# Patient Record
Sex: Female | Born: 1960 | Race: White | Hispanic: No | Marital: Married | Smoking: Never smoker
Health system: Southern US, Community
[De-identification: ages and names within clinical notes are randomized; demographics above are authoritative.]

## PROBLEM LIST (undated history)

## (undated) DIAGNOSIS — R142 Eructation: Secondary | ICD-10-CM

## (undated) DIAGNOSIS — R51 Headache: Secondary | ICD-10-CM

## (undated) DIAGNOSIS — R21 Rash and other nonspecific skin eruption: Secondary | ICD-10-CM

## (undated) DIAGNOSIS — R143 Flatulence: Secondary | ICD-10-CM

## (undated) DIAGNOSIS — J45909 Unspecified asthma, uncomplicated: Secondary | ICD-10-CM

## (undated) DIAGNOSIS — I479 Paroxysmal tachycardia, unspecified: Secondary | ICD-10-CM

## (undated) DIAGNOSIS — K219 Gastro-esophageal reflux disease without esophagitis: Secondary | ICD-10-CM

## (undated) DIAGNOSIS — R61 Generalized hyperhidrosis: Secondary | ICD-10-CM

## (undated) DIAGNOSIS — M545 Low back pain, unspecified: Secondary | ICD-10-CM

## (undated) DIAGNOSIS — C50919 Malignant neoplasm of unspecified site of unspecified female breast: Secondary | ICD-10-CM

## (undated) DIAGNOSIS — M25519 Pain in unspecified shoulder: Secondary | ICD-10-CM

## (undated) DIAGNOSIS — R42 Dizziness and giddiness: Secondary | ICD-10-CM

## (undated) DIAGNOSIS — F329 Major depressive disorder, single episode, unspecified: Secondary | ICD-10-CM

## (undated) DIAGNOSIS — Z7721 Contact with and (suspected) exposure to potentially hazardous body fluids: Secondary | ICD-10-CM

## (undated) DIAGNOSIS — R9431 Abnormal electrocardiogram [ECG] [EKG]: Secondary | ICD-10-CM

## (undated) DIAGNOSIS — M542 Cervicalgia: Secondary | ICD-10-CM

## (undated) DIAGNOSIS — G510 Bell's palsy: Secondary | ICD-10-CM

## (undated) DIAGNOSIS — Z853 Personal history of malignant neoplasm of breast: Secondary | ICD-10-CM

## (undated) DIAGNOSIS — K6289 Other specified diseases of anus and rectum: Secondary | ICD-10-CM

## (undated) DIAGNOSIS — M79609 Pain in unspecified limb: Secondary | ICD-10-CM

## (undated) DIAGNOSIS — R141 Gas pain: Secondary | ICD-10-CM

## (undated) DIAGNOSIS — Z923 Personal history of irradiation: Secondary | ICD-10-CM

## (undated) DIAGNOSIS — R071 Chest pain on breathing: Secondary | ICD-10-CM

## (undated) DIAGNOSIS — G43019 Migraine without aura, intractable, without status migrainosus: Secondary | ICD-10-CM

## (undated) DIAGNOSIS — R635 Abnormal weight gain: Secondary | ICD-10-CM

## (undated) DIAGNOSIS — K589 Irritable bowel syndrome without diarrhea: Secondary | ICD-10-CM

## (undated) DIAGNOSIS — F32A Depression, unspecified: Secondary | ICD-10-CM

## (undated) DIAGNOSIS — N3090 Cystitis, unspecified without hematuria: Secondary | ICD-10-CM

## (undated) DIAGNOSIS — R1110 Vomiting, unspecified: Secondary | ICD-10-CM

## (undated) DIAGNOSIS — J309 Allergic rhinitis, unspecified: Secondary | ICD-10-CM

## (undated) DIAGNOSIS — K279 Peptic ulcer, site unspecified, unspecified as acute or chronic, without hemorrhage or perforation: Secondary | ICD-10-CM

## (undated) DIAGNOSIS — K449 Diaphragmatic hernia without obstruction or gangrene: Secondary | ICD-10-CM

## (undated) HISTORY — DX: Major depressive disorder, single episode, unspecified: F32.9

## (undated) HISTORY — DX: Paroxysmal tachycardia, unspecified: I47.9

## (undated) HISTORY — DX: Pain in unspecified limb: M79.609

## (undated) HISTORY — DX: Irritable bowel syndrome, unspecified: K58.9

## (undated) HISTORY — DX: Vomiting, unspecified: R11.10

## (undated) HISTORY — DX: Chest pain on breathing: R07.1

## (undated) HISTORY — DX: Rash and other nonspecific skin eruption: R21

## (undated) HISTORY — DX: Gastro-esophageal reflux disease without esophagitis: K21.9

## (undated) HISTORY — DX: Eructation: R14.2

## (undated) HISTORY — DX: Pain in unspecified shoulder: M25.519

## (undated) HISTORY — DX: Depression, unspecified: F32.A

## (undated) HISTORY — DX: Cervicalgia: M54.2

## (undated) HISTORY — DX: Allergic rhinitis, unspecified: J30.9

## (undated) HISTORY — DX: Dizziness and giddiness: R42

## (undated) HISTORY — DX: Malignant neoplasm of unspecified site of unspecified female breast: C50.919

## (undated) HISTORY — DX: Low back pain, unspecified: M54.50

## (undated) HISTORY — DX: Contact with and (suspected) exposure to potentially hazardous body fluids: Z77.21

## (undated) HISTORY — DX: Headache: R51

## (undated) HISTORY — DX: Flatulence: R14.3

## (undated) HISTORY — DX: Diaphragmatic hernia without obstruction or gangrene: K44.9

## (undated) HISTORY — DX: Other specified diseases of anus and rectum: K62.89

## (undated) HISTORY — PX: TUMOR REMOVAL: SHX12

## (undated) HISTORY — DX: Migraine without aura, intractable, without status migrainosus: G43.019

## (undated) HISTORY — DX: Gas pain: R14.1

## (undated) HISTORY — DX: Abnormal electrocardiogram (ECG) (EKG): R94.31

## (undated) HISTORY — DX: Generalized hyperhidrosis: R61

## (undated) HISTORY — DX: Personal history of malignant neoplasm of breast: Z85.3

## (undated) HISTORY — PX: BREAST LUMPECTOMY: SHX2

## (undated) HISTORY — DX: Low back pain: M54.5

## (undated) HISTORY — DX: Peptic ulcer, site unspecified, unspecified as acute or chronic, without hemorrhage or perforation: K27.9

## (undated) HISTORY — DX: Cystitis, unspecified without hematuria: N30.90

## (undated) HISTORY — PX: HYSTEROSCOPY WITH D & C: SHX1775

## (undated) HISTORY — DX: Unspecified asthma, uncomplicated: J45.909

## (undated) HISTORY — DX: Bell's palsy: G51.0

## (undated) HISTORY — DX: Abnormal weight gain: R63.5

---

## 1998-02-21 ENCOUNTER — Encounter: Payer: Self-pay | Admitting: Internal Medicine

## 1998-02-21 ENCOUNTER — Emergency Department (HOSPITAL_COMMUNITY): Admission: EM | Admit: 1998-02-21 | Discharge: 1998-02-21 | Payer: Self-pay | Admitting: Emergency Medicine

## 2000-01-10 ENCOUNTER — Other Ambulatory Visit: Admission: RE | Admit: 2000-01-10 | Discharge: 2000-01-10 | Payer: Self-pay | Admitting: Obstetrics & Gynecology

## 2000-05-12 HISTORY — PX: LIPOMA EXCISION: SHX5283

## 2000-05-12 HISTORY — PX: OTHER SURGICAL HISTORY: SHX169

## 2001-06-11 ENCOUNTER — Other Ambulatory Visit: Admission: RE | Admit: 2001-06-11 | Discharge: 2001-06-11 | Payer: Self-pay | Admitting: Obstetrics & Gynecology

## 2004-07-11 ENCOUNTER — Ambulatory Visit: Payer: Self-pay | Admitting: Internal Medicine

## 2004-09-26 ENCOUNTER — Ambulatory Visit: Payer: Self-pay | Admitting: Internal Medicine

## 2004-12-05 ENCOUNTER — Ambulatory Visit: Payer: Self-pay | Admitting: Internal Medicine

## 2005-04-21 ENCOUNTER — Ambulatory Visit: Payer: Self-pay | Admitting: Internal Medicine

## 2005-06-20 ENCOUNTER — Ambulatory Visit: Payer: Self-pay | Admitting: Internal Medicine

## 2005-10-23 ENCOUNTER — Ambulatory Visit: Payer: Self-pay | Admitting: Internal Medicine

## 2005-10-25 ENCOUNTER — Ambulatory Visit: Payer: Self-pay | Admitting: Family Medicine

## 2005-12-31 ENCOUNTER — Ambulatory Visit: Payer: Self-pay | Admitting: Internal Medicine

## 2006-01-28 ENCOUNTER — Ambulatory Visit: Payer: Self-pay | Admitting: Internal Medicine

## 2006-03-03 ENCOUNTER — Ambulatory Visit: Payer: Self-pay | Admitting: Internal Medicine

## 2006-03-20 ENCOUNTER — Ambulatory Visit: Payer: Self-pay | Admitting: Gastroenterology

## 2006-05-21 ENCOUNTER — Other Ambulatory Visit: Admission: RE | Admit: 2006-05-21 | Discharge: 2006-05-21 | Payer: Self-pay | Admitting: Gynecology

## 2006-06-15 ENCOUNTER — Encounter: Admission: RE | Admit: 2006-06-15 | Discharge: 2006-06-15 | Payer: Self-pay | Admitting: Gynecology

## 2006-07-01 ENCOUNTER — Encounter: Admission: RE | Admit: 2006-07-01 | Discharge: 2006-07-01 | Payer: Self-pay | Admitting: Gynecology

## 2006-09-01 ENCOUNTER — Emergency Department (HOSPITAL_COMMUNITY): Admission: EM | Admit: 2006-09-01 | Discharge: 2006-09-01 | Payer: Self-pay | Admitting: Family Medicine

## 2006-11-04 ENCOUNTER — Ambulatory Visit: Payer: Self-pay | Admitting: Internal Medicine

## 2006-11-19 ENCOUNTER — Ambulatory Visit: Payer: Self-pay | Admitting: Internal Medicine

## 2006-12-17 ENCOUNTER — Ambulatory Visit: Payer: Self-pay | Admitting: Internal Medicine

## 2006-12-18 ENCOUNTER — Encounter: Admission: RE | Admit: 2006-12-18 | Discharge: 2007-02-05 | Payer: Self-pay | Admitting: Internal Medicine

## 2007-01-30 ENCOUNTER — Encounter: Payer: Self-pay | Admitting: Internal Medicine

## 2007-01-30 DIAGNOSIS — K219 Gastro-esophageal reflux disease without esophagitis: Secondary | ICD-10-CM | POA: Insufficient documentation

## 2007-01-30 DIAGNOSIS — J45909 Unspecified asthma, uncomplicated: Secondary | ICD-10-CM | POA: Insufficient documentation

## 2007-02-09 ENCOUNTER — Encounter: Admission: RE | Admit: 2007-02-09 | Discharge: 2007-02-09 | Payer: Self-pay | Admitting: Gynecology

## 2007-02-09 ENCOUNTER — Encounter (INDEPENDENT_AMBULATORY_CARE_PROVIDER_SITE_OTHER): Payer: Self-pay | Admitting: Diagnostic Radiology

## 2007-02-14 ENCOUNTER — Encounter: Admission: RE | Admit: 2007-02-14 | Discharge: 2007-02-14 | Payer: Self-pay | Admitting: Gynecology

## 2007-02-15 ENCOUNTER — Encounter: Admission: RE | Admit: 2007-02-15 | Discharge: 2007-02-15 | Payer: Self-pay | Admitting: Gynecology

## 2007-02-15 ENCOUNTER — Encounter (INDEPENDENT_AMBULATORY_CARE_PROVIDER_SITE_OTHER): Payer: Self-pay | Admitting: Diagnostic Radiology

## 2007-03-01 ENCOUNTER — Encounter (INDEPENDENT_AMBULATORY_CARE_PROVIDER_SITE_OTHER): Payer: Self-pay | Admitting: Surgery

## 2007-03-01 ENCOUNTER — Ambulatory Visit (HOSPITAL_COMMUNITY): Admission: RE | Admit: 2007-03-01 | Discharge: 2007-03-01 | Payer: Self-pay | Admitting: Surgery

## 2007-03-03 ENCOUNTER — Ambulatory Visit: Payer: Self-pay | Admitting: Oncology

## 2007-03-08 ENCOUNTER — Ambulatory Visit: Admission: RE | Admit: 2007-03-08 | Discharge: 2007-05-12 | Payer: Self-pay | Admitting: Radiation Oncology

## 2007-03-09 ENCOUNTER — Encounter: Payer: Self-pay | Admitting: Internal Medicine

## 2007-03-09 LAB — COMPREHENSIVE METABOLIC PANEL
Albumin: 4.3 g/dL (ref 3.5–5.2)
Alkaline Phosphatase: 75 U/L (ref 39–117)
BUN: 13 mg/dL (ref 6–23)
Calcium: 9.3 mg/dL (ref 8.4–10.5)
Glucose, Bld: 98 mg/dL (ref 70–99)
Potassium: 4 mEq/L (ref 3.5–5.3)

## 2007-03-09 LAB — LACTATE DEHYDROGENASE: LDH: 146 U/L (ref 94–250)

## 2007-03-09 LAB — CBC WITH DIFFERENTIAL/PLATELET
Basophils Absolute: 0 10*3/uL (ref 0.0–0.1)
Eosinophils Absolute: 0 10*3/uL (ref 0.0–0.5)
HCT: 29.4 % — ABNORMAL LOW (ref 34.8–46.6)
HGB: 9.9 g/dL — ABNORMAL LOW (ref 11.6–15.9)
LYMPH%: 26.2 % (ref 14.0–48.0)
MCV: 77.4 fL — ABNORMAL LOW (ref 81.0–101.0)
MONO%: 9.2 % (ref 0.0–13.0)
NEUT#: 2.8 10*3/uL (ref 1.5–6.5)
NEUT%: 63.3 % (ref 39.6–76.8)
Platelets: 263 10*3/uL (ref 145–400)
RDW: 16.3 % — ABNORMAL HIGH (ref 11.3–14.5)

## 2007-03-10 ENCOUNTER — Encounter: Admission: RE | Admit: 2007-03-10 | Discharge: 2007-03-10 | Payer: Self-pay | Admitting: Oncology

## 2007-03-12 ENCOUNTER — Encounter (INDEPENDENT_AMBULATORY_CARE_PROVIDER_SITE_OTHER): Payer: Self-pay | Admitting: *Deleted

## 2007-03-12 ENCOUNTER — Ambulatory Visit (HOSPITAL_COMMUNITY): Admission: RE | Admit: 2007-03-12 | Discharge: 2007-03-12 | Payer: Self-pay | Admitting: Oncology

## 2007-03-14 LAB — VITAMIN D PNL(25-HYDRXY+1,25-DIHY)-BLD: Vit D, 25-Hydroxy: 15 ng/mL — ABNORMAL LOW (ref 30–89)

## 2007-03-15 ENCOUNTER — Other Ambulatory Visit: Admission: RE | Admit: 2007-03-15 | Discharge: 2007-03-15 | Payer: Self-pay | Admitting: Gynecology

## 2007-03-19 LAB — CBC & DIFF AND RETIC
BASO%: 0.5 % (ref 0.0–2.0)
Basophils Absolute: 0 10*3/uL (ref 0.0–0.1)
EOS%: 0.6 % (ref 0.0–7.0)
HCT: 26.8 % — ABNORMAL LOW (ref 34.8–46.6)
LYMPH%: 22.3 % (ref 14.0–48.0)
MCH: 26.7 pg (ref 26.0–34.0)
MCHC: 34.1 g/dL (ref 32.0–36.0)
MCV: 78.3 fL — ABNORMAL LOW (ref 81.0–101.0)
MONO%: 5.6 % (ref 0.0–13.0)
NEUT%: 71 % (ref 39.6–76.8)
lymph#: 1.1 10*3/uL (ref 0.9–3.3)

## 2007-03-19 LAB — IRON AND TIBC
%SAT: 4 % — ABNORMAL LOW (ref 20–55)
TIBC: 372 ug/dL (ref 250–470)

## 2007-03-19 LAB — FERRITIN: Ferritin: 22 ng/mL (ref 10–291)

## 2007-03-25 ENCOUNTER — Encounter: Payer: Self-pay | Admitting: Internal Medicine

## 2007-03-29 LAB — ESTRADIOL, ULTRA SENS: Estradiol, Ultra Sensitive: 181 pg/mL

## 2007-04-14 ENCOUNTER — Ambulatory Visit: Payer: Self-pay | Admitting: Oncology

## 2007-04-29 LAB — CHCC SMEAR

## 2007-04-29 LAB — CBC & DIFF AND RETIC
Basophils Absolute: 0 10*3/uL (ref 0.0–0.1)
Eosinophils Absolute: 0 10*3/uL (ref 0.0–0.5)
HCT: 34.2 % — ABNORMAL LOW (ref 34.8–46.6)
HGB: 11.8 g/dL (ref 11.6–15.9)
IRF: 0.27 (ref 0.130–0.330)
LYMPH%: 16.6 % (ref 14.0–48.0)
MONO#: 0.4 10*3/uL (ref 0.1–0.9)
NEUT#: 3.6 10*3/uL (ref 1.5–6.5)
NEUT%: 75.2 % (ref 39.6–76.8)
Platelets: 210 10*3/uL (ref 145–400)
WBC: 4.8 10*3/uL (ref 3.9–10.0)
lymph#: 0.8 10*3/uL — ABNORMAL LOW (ref 0.9–3.3)

## 2007-04-29 LAB — FERRITIN: Ferritin: 11 ng/mL (ref 10–291)

## 2007-04-29 LAB — COMPREHENSIVE METABOLIC PANEL
ALT: 16 U/L (ref 0–35)
AST: 16 U/L (ref 0–37)
Albumin: 4.3 g/dL (ref 3.5–5.2)
Alkaline Phosphatase: 56 U/L (ref 39–117)
Potassium: 3.8 mEq/L (ref 3.5–5.3)
Sodium: 140 mEq/L (ref 135–145)
Total Protein: 6.7 g/dL (ref 6.0–8.3)

## 2007-04-29 LAB — IRON AND TIBC: %SAT: 26 % (ref 20–55)

## 2007-04-29 LAB — CANCER ANTIGEN 27.29: CA 27.29: 18 U/mL (ref 0–39)

## 2007-05-13 ENCOUNTER — Ambulatory Visit: Admission: RE | Admit: 2007-05-13 | Discharge: 2007-06-06 | Payer: Self-pay | Admitting: Radiation Oncology

## 2007-06-03 ENCOUNTER — Telehealth: Payer: Self-pay | Admitting: Internal Medicine

## 2007-06-03 ENCOUNTER — Encounter: Payer: Self-pay | Admitting: Internal Medicine

## 2007-06-16 ENCOUNTER — Ambulatory Visit: Payer: Self-pay | Admitting: Oncology

## 2007-07-02 LAB — COMPREHENSIVE METABOLIC PANEL
ALT: 16 U/L (ref 0–35)
BUN: 10 mg/dL (ref 6–23)
CO2: 25 mEq/L (ref 19–32)
Calcium: 9.1 mg/dL (ref 8.4–10.5)
Chloride: 102 mEq/L (ref 96–112)
Creatinine, Ser: 0.69 mg/dL (ref 0.40–1.20)
Total Bilirubin: 0.6 mg/dL (ref 0.3–1.2)

## 2007-07-02 LAB — CBC & DIFF AND RETIC
BASO%: 0.4 % (ref 0.0–2.0)
EOS%: 0.9 % (ref 0.0–7.0)
HCT: 32.3 % — ABNORMAL LOW (ref 34.8–46.6)
LYMPH%: 18.5 % (ref 14.0–48.0)
MCH: 30.5 pg (ref 26.0–34.0)
MCHC: 35.5 g/dL (ref 32.0–36.0)
MONO#: 0.3 10*3/uL (ref 0.1–0.9)
NEUT%: 73.3 % (ref 39.6–76.8)
Platelets: 181 10*3/uL (ref 145–400)
RBC: 3.76 10*6/uL (ref 3.70–5.32)
Retic %: 0.6 % (ref 0.4–2.3)
WBC: 3.8 10*3/uL — ABNORMAL LOW (ref 3.9–10.0)
lymph#: 0.7 10*3/uL — ABNORMAL LOW (ref 0.9–3.3)

## 2007-07-02 LAB — IRON AND TIBC: Iron: 75 ug/dL (ref 42–145)

## 2007-07-02 LAB — CHCC SMEAR

## 2007-07-02 LAB — LACTATE DEHYDROGENASE: LDH: 148 U/L (ref 94–250)

## 2007-07-16 ENCOUNTER — Ambulatory Visit: Payer: Self-pay | Admitting: Internal Medicine

## 2007-07-22 ENCOUNTER — Encounter: Payer: Self-pay | Admitting: Internal Medicine

## 2007-07-22 ENCOUNTER — Ambulatory Visit: Payer: Self-pay | Admitting: Internal Medicine

## 2007-07-23 ENCOUNTER — Ambulatory Visit: Payer: Self-pay | Admitting: Internal Medicine

## 2007-07-23 DIAGNOSIS — R42 Dizziness and giddiness: Secondary | ICD-10-CM | POA: Insufficient documentation

## 2007-07-23 DIAGNOSIS — M542 Cervicalgia: Secondary | ICD-10-CM | POA: Insufficient documentation

## 2007-07-23 DIAGNOSIS — R635 Abnormal weight gain: Secondary | ICD-10-CM | POA: Insufficient documentation

## 2007-07-23 DIAGNOSIS — C50919 Malignant neoplasm of unspecified site of unspecified female breast: Secondary | ICD-10-CM | POA: Insufficient documentation

## 2007-07-27 ENCOUNTER — Encounter: Payer: Self-pay | Admitting: Internal Medicine

## 2007-08-03 ENCOUNTER — Encounter: Payer: Self-pay | Admitting: Internal Medicine

## 2007-08-03 ENCOUNTER — Telehealth: Payer: Self-pay | Admitting: Internal Medicine

## 2007-08-11 ENCOUNTER — Encounter: Admission: RE | Admit: 2007-08-11 | Discharge: 2007-11-09 | Payer: Self-pay | Admitting: Internal Medicine

## 2007-08-11 ENCOUNTER — Encounter: Payer: Self-pay | Admitting: Internal Medicine

## 2007-08-27 ENCOUNTER — Ambulatory Visit: Payer: Self-pay | Admitting: Internal Medicine

## 2007-08-27 ENCOUNTER — Encounter: Payer: Self-pay | Admitting: Internal Medicine

## 2007-08-27 DIAGNOSIS — R071 Chest pain on breathing: Secondary | ICD-10-CM | POA: Insufficient documentation

## 2007-08-27 DIAGNOSIS — R61 Generalized hyperhidrosis: Secondary | ICD-10-CM | POA: Insufficient documentation

## 2007-08-27 DIAGNOSIS — J309 Allergic rhinitis, unspecified: Secondary | ICD-10-CM | POA: Insufficient documentation

## 2007-09-03 DIAGNOSIS — K6289 Other specified diseases of anus and rectum: Secondary | ICD-10-CM | POA: Insufficient documentation

## 2007-09-03 DIAGNOSIS — K279 Peptic ulcer, site unspecified, unspecified as acute or chronic, without hemorrhage or perforation: Secondary | ICD-10-CM | POA: Insufficient documentation

## 2007-09-03 DIAGNOSIS — K449 Diaphragmatic hernia without obstruction or gangrene: Secondary | ICD-10-CM | POA: Insufficient documentation

## 2007-09-07 ENCOUNTER — Ambulatory Visit: Payer: Self-pay | Admitting: Oncology

## 2007-09-08 ENCOUNTER — Encounter: Payer: Self-pay | Admitting: Internal Medicine

## 2007-09-09 ENCOUNTER — Encounter: Payer: Self-pay | Admitting: Internal Medicine

## 2007-09-24 LAB — CBC WITH DIFFERENTIAL/PLATELET
BASO%: 0.4 % (ref 0.0–2.0)
EOS%: 0.8 % (ref 0.0–7.0)
HCT: 33.8 % — ABNORMAL LOW (ref 34.8–46.6)
MCH: 30.8 pg (ref 26.0–34.0)
MCHC: 35.1 g/dL (ref 32.0–36.0)
MONO#: 0.3 10*3/uL (ref 0.1–0.9)
RBC: 3.85 10*6/uL (ref 3.70–5.32)
RDW: 12.7 % (ref 11.3–14.5)
WBC: 4 10*3/uL (ref 3.9–10.0)
lymph#: 0.8 10*3/uL — ABNORMAL LOW (ref 0.9–3.3)

## 2007-09-24 LAB — COMPREHENSIVE METABOLIC PANEL
ALT: 16 U/L (ref 0–35)
AST: 20 U/L (ref 0–37)
Albumin: 4.3 g/dL (ref 3.5–5.2)
CO2: 27 mEq/L (ref 19–32)
Calcium: 9.1 mg/dL (ref 8.4–10.5)
Chloride: 108 mEq/L (ref 96–112)
Creatinine, Ser: 0.74 mg/dL (ref 0.40–1.20)
Potassium: 3.6 mEq/L (ref 3.5–5.3)
Sodium: 143 mEq/L (ref 135–145)
Total Protein: 6.7 g/dL (ref 6.0–8.3)

## 2007-09-24 LAB — CANCER ANTIGEN 27.29: CA 27.29: 18 U/mL (ref 0–39)

## 2007-09-30 ENCOUNTER — Emergency Department (HOSPITAL_COMMUNITY): Admission: EM | Admit: 2007-09-30 | Discharge: 2007-09-30 | Payer: Self-pay | Admitting: Family Medicine

## 2007-09-30 LAB — ESTRADIOL, ULTRA SENS: Estradiol, Ultra Sensitive: 45 pg/mL

## 2007-10-01 ENCOUNTER — Ambulatory Visit: Payer: Self-pay | Admitting: Internal Medicine

## 2007-10-01 DIAGNOSIS — Z7721 Contact with and (suspected) exposure to potentially hazardous body fluids: Secondary | ICD-10-CM | POA: Insufficient documentation

## 2007-10-01 DIAGNOSIS — R21 Rash and other nonspecific skin eruption: Secondary | ICD-10-CM | POA: Insufficient documentation

## 2007-11-10 HISTORY — PX: OTHER SURGICAL HISTORY: SHX169

## 2007-11-26 ENCOUNTER — Ambulatory Visit: Payer: Self-pay | Admitting: Internal Medicine

## 2008-01-13 ENCOUNTER — Telehealth: Payer: Self-pay | Admitting: Internal Medicine

## 2008-01-14 ENCOUNTER — Ambulatory Visit: Payer: Self-pay | Admitting: Oncology

## 2008-01-14 ENCOUNTER — Ambulatory Visit: Payer: Self-pay | Admitting: Internal Medicine

## 2008-01-14 LAB — CBC WITH DIFFERENTIAL/PLATELET
Basophils Absolute: 0 10*3/uL (ref 0.0–0.1)
HCT: 34.3 % — ABNORMAL LOW (ref 34.8–46.6)
HGB: 11.9 g/dL (ref 11.6–15.9)
LYMPH%: 18 % (ref 14.0–48.0)
MONO#: 0.4 10*3/uL (ref 0.1–0.9)
NEUT%: 74.5 % (ref 39.6–76.8)
Platelets: 174 10*3/uL (ref 145–400)
WBC: 5.4 10*3/uL (ref 3.9–10.0)
lymph#: 1 10*3/uL (ref 0.9–3.3)

## 2008-01-14 LAB — LACTATE DEHYDROGENASE: LDH: 158 U/L (ref 94–250)

## 2008-01-14 LAB — COMPREHENSIVE METABOLIC PANEL
BUN: 13 mg/dL (ref 6–23)
CO2: 25 mEq/L (ref 19–32)
Calcium: 9.4 mg/dL (ref 8.4–10.5)
Chloride: 109 mEq/L (ref 96–112)
Creatinine, Ser: 0.62 mg/dL (ref 0.40–1.20)
Glucose, Bld: 89 mg/dL (ref 70–99)

## 2008-01-14 LAB — CANCER ANTIGEN 27.29: CA 27.29: 19 U/mL (ref 0–39)

## 2008-01-27 ENCOUNTER — Encounter: Payer: Self-pay | Admitting: Internal Medicine

## 2008-02-11 ENCOUNTER — Ambulatory Visit (HOSPITAL_COMMUNITY): Admission: RE | Admit: 2008-02-11 | Discharge: 2008-02-11 | Payer: Self-pay | Admitting: Oncology

## 2008-02-18 ENCOUNTER — Ambulatory Visit: Payer: Self-pay | Admitting: Internal Medicine

## 2008-02-18 DIAGNOSIS — R141 Gas pain: Secondary | ICD-10-CM | POA: Insufficient documentation

## 2008-02-18 DIAGNOSIS — R143 Flatulence: Secondary | ICD-10-CM

## 2008-02-18 DIAGNOSIS — R142 Eructation: Secondary | ICD-10-CM

## 2008-03-06 ENCOUNTER — Other Ambulatory Visit: Admission: RE | Admit: 2008-03-06 | Discharge: 2008-03-06 | Payer: Self-pay | Admitting: Obstetrics & Gynecology

## 2008-03-10 ENCOUNTER — Ambulatory Visit (HOSPITAL_COMMUNITY): Admission: RE | Admit: 2008-03-10 | Discharge: 2008-03-10 | Payer: Self-pay | Admitting: Obstetrics & Gynecology

## 2008-03-22 ENCOUNTER — Ambulatory Visit: Payer: Self-pay | Admitting: Internal Medicine

## 2008-03-22 DIAGNOSIS — R1110 Vomiting, unspecified: Secondary | ICD-10-CM | POA: Insufficient documentation

## 2008-03-22 LAB — CONVERTED CEMR LAB: Estradiol: 60.7 pg/mL

## 2008-03-24 ENCOUNTER — Ambulatory Visit: Payer: Self-pay | Admitting: Oncology

## 2008-03-24 ENCOUNTER — Telehealth: Payer: Self-pay | Admitting: Internal Medicine

## 2008-03-24 LAB — CONVERTED CEMR LAB
ALT: 22 units/L (ref 0–35)
AST: 21 units/L (ref 0–37)
Albumin: 3.5 g/dL (ref 3.5–5.2)
Alkaline Phosphatase: 50 units/L (ref 39–117)
Basophils Absolute: 0.1 10*3/uL (ref 0.0–0.1)
Basophils Relative: 1.2 % (ref 0.0–3.0)
Bilirubin, Direct: 0.1 mg/dL (ref 0.0–0.3)
CO2: 28 meq/L (ref 19–32)
Crystals: NEGATIVE
Eosinophils Relative: 1.2 % (ref 0.0–5.0)
HCT: 33.7 % — ABNORMAL LOW (ref 36.0–46.0)
Hemoglobin: 11.5 g/dL — ABNORMAL LOW (ref 12.0–15.0)
Lymphocytes Relative: 24.7 % (ref 12.0–46.0)
MCV: 90 fL (ref 78.0–100.0)
Monocytes Relative: 10.6 % (ref 3.0–12.0)
Mucus, UA: NEGATIVE
Neutrophils Relative %: 62.3 % (ref 43.0–77.0)
Platelets: 171 10*3/uL (ref 150–400)
Sodium: 139 meq/L (ref 135–145)
Total Protein: 6.1 g/dL (ref 6.0–8.3)
WBC: 4.9 10*3/uL (ref 4.5–10.5)

## 2008-03-24 LAB — CBC WITH DIFFERENTIAL/PLATELET
BASO%: 0.5 % (ref 0.0–2.0)
HCT: 32.8 % — ABNORMAL LOW (ref 34.8–46.6)
LYMPH%: 25 % (ref 14.0–48.0)
MCHC: 34.4 g/dL (ref 32.0–36.0)
MCV: 90.1 fL (ref 81.0–101.0)
MONO#: 0.3 10*3/uL (ref 0.1–0.9)
MONO%: 8 % (ref 0.0–13.0)
NEUT%: 65.3 % (ref 39.6–76.8)
Platelets: 185 10*3/uL (ref 145–400)
RBC: 3.64 10*6/uL — ABNORMAL LOW (ref 3.70–5.32)
WBC: 4.3 10*3/uL (ref 3.9–10.0)

## 2008-03-30 ENCOUNTER — Encounter: Payer: Self-pay | Admitting: Internal Medicine

## 2008-03-31 ENCOUNTER — Ambulatory Visit: Payer: Self-pay | Admitting: Cardiovascular Disease

## 2008-04-01 LAB — ESTRADIOL, ULTRA SENS

## 2008-04-21 DIAGNOSIS — K589 Irritable bowel syndrome without diarrhea: Secondary | ICD-10-CM | POA: Insufficient documentation

## 2008-04-21 LAB — COMPREHENSIVE METABOLIC PANEL
ALT: 24 U/L (ref 0–35)
Albumin: 3.9 g/dL (ref 3.5–5.2)
Alkaline Phosphatase: 48 U/L (ref 39–117)
CO2: 26 mEq/L (ref 19–32)
Glucose, Bld: 109 mg/dL — ABNORMAL HIGH (ref 70–99)
Potassium: 3.6 mEq/L (ref 3.5–5.3)
Sodium: 140 mEq/L (ref 135–145)
Total Protein: 6.7 g/dL (ref 6.0–8.3)

## 2008-04-21 LAB — HCG, SERUM, QUALITATIVE: Preg, Serum: NEGATIVE

## 2008-04-26 ENCOUNTER — Encounter: Payer: Self-pay | Admitting: Internal Medicine

## 2008-05-13 IMAGING — CR DG CHEST 2V
2 series · 2 of 2 positions shown · non-contrast
Comparison: none

CLINICAL DATA: Breast carcinoma, preop

Chest 2 view:
No previous available for comparison. The heart size and mediastinal contours
are within normal limits.  Both lungs are clear.  The visualized skeletal
structures are unremarkable.

[view not recorded (1 of 2)]
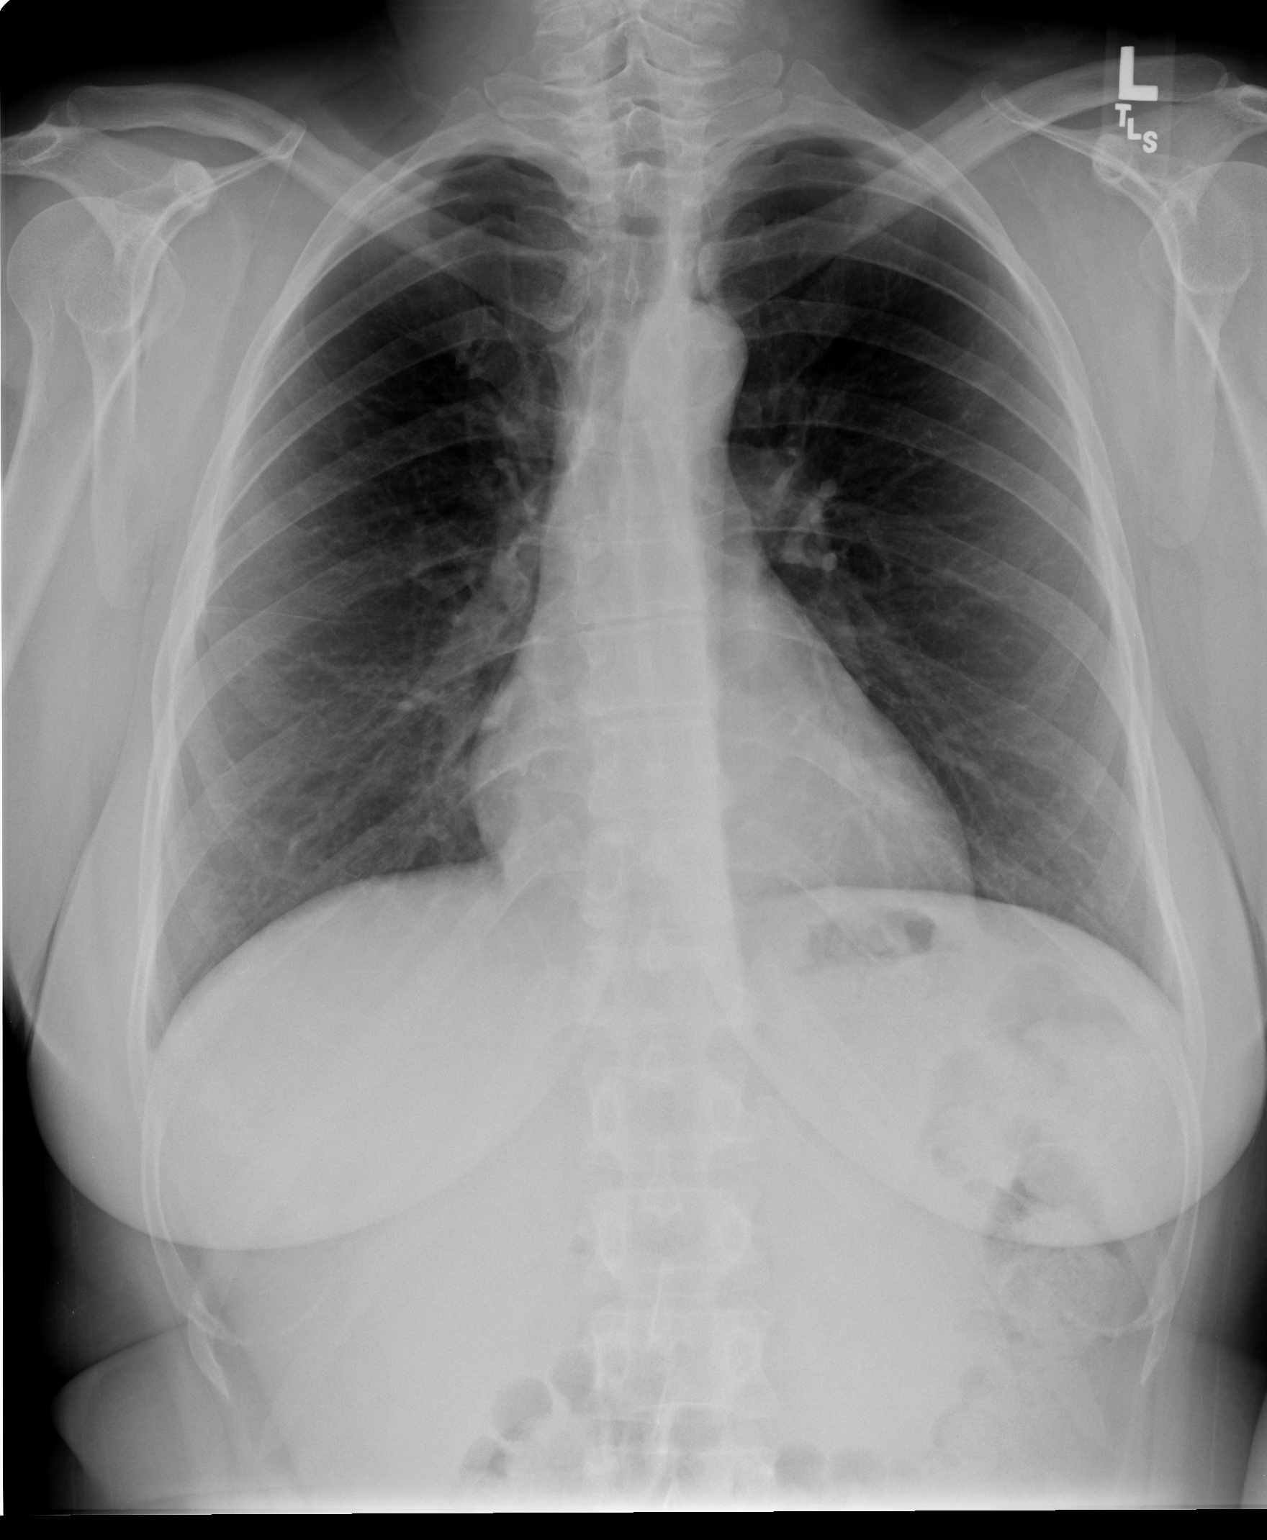

[view not recorded (2 of 2)]
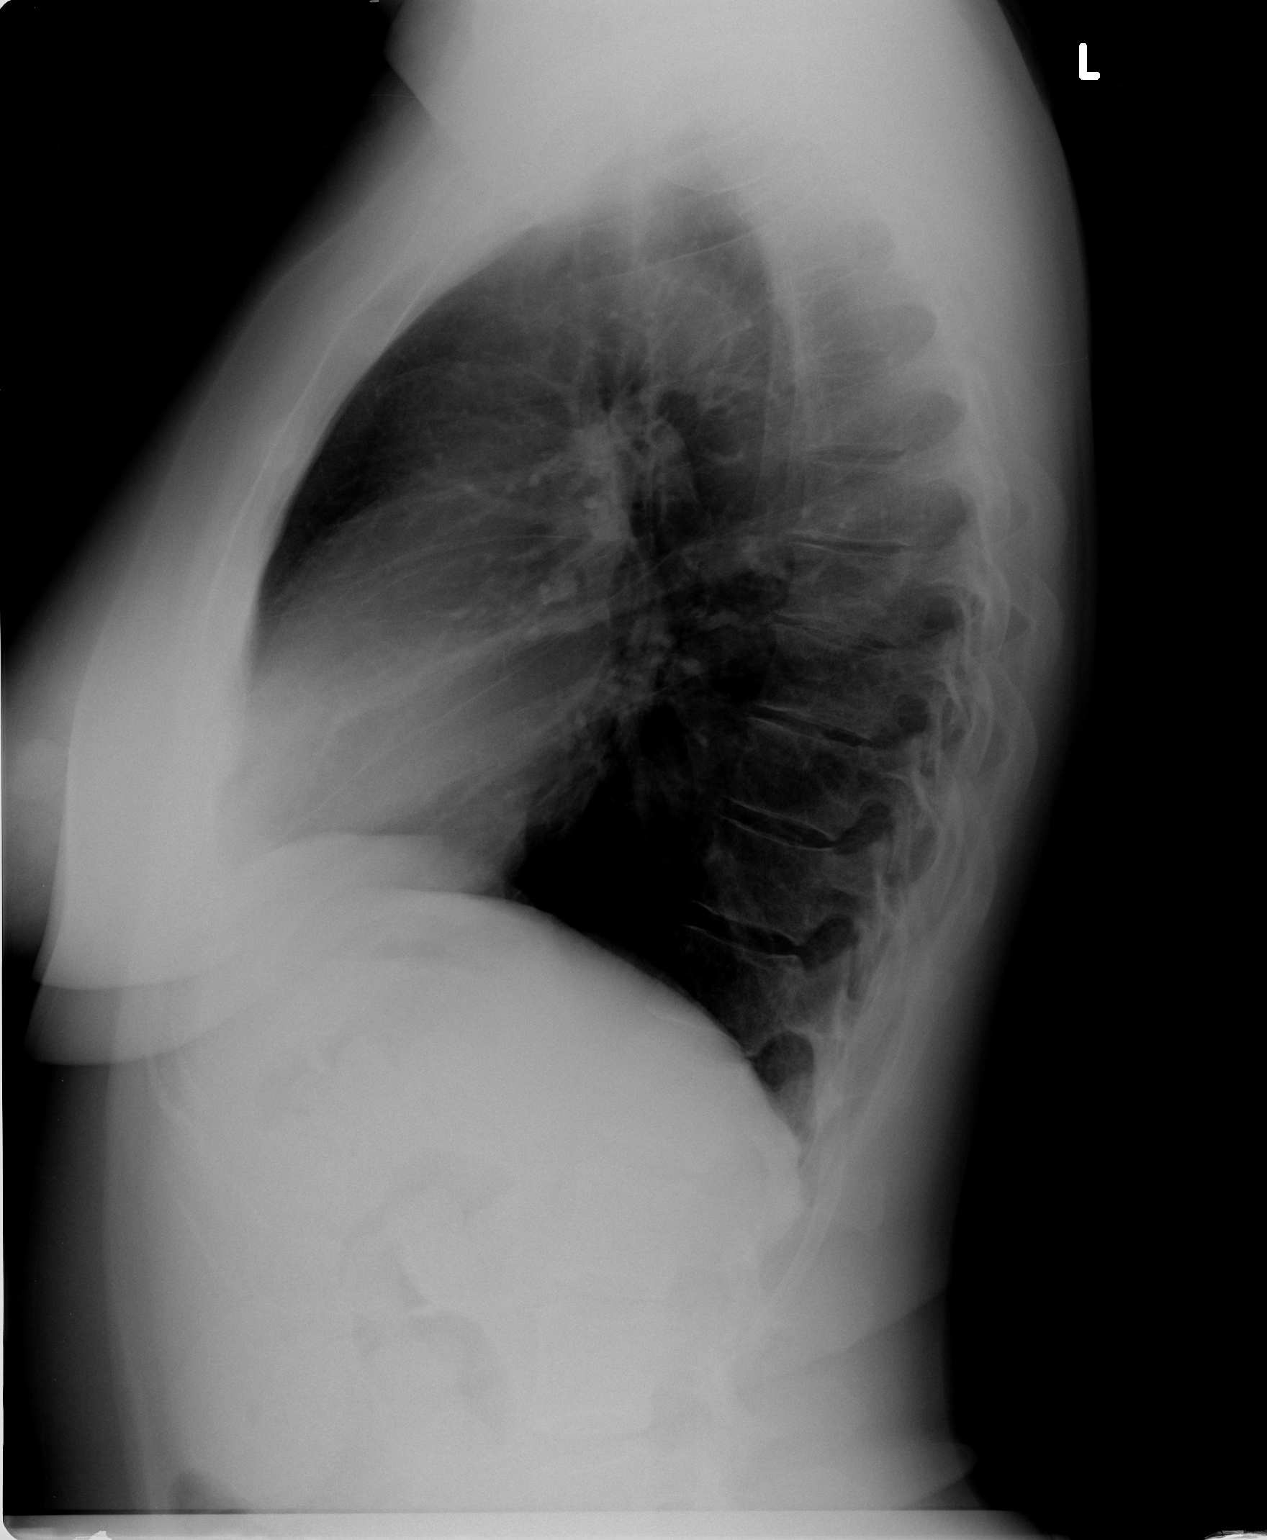

[2 of 2 positions shown; findings below may reference images not displayed]

IMPRESSION: 1. No active cardiopulmonary disease.

## 2008-05-29 ENCOUNTER — Encounter: Admission: RE | Admit: 2008-05-29 | Discharge: 2008-08-27 | Payer: Self-pay | Admitting: Orthopedic Surgery

## 2008-07-21 ENCOUNTER — Ambulatory Visit: Payer: Self-pay | Admitting: Endocrinology

## 2008-08-03 ENCOUNTER — Ambulatory Visit: Payer: Self-pay | Admitting: Oncology

## 2008-08-07 LAB — CBC WITH DIFFERENTIAL/PLATELET
BASO%: 0.4 % (ref 0.0–2.0)
EOS%: 0.8 % (ref 0.0–7.0)
HGB: 11.2 g/dL — ABNORMAL LOW (ref 11.6–15.9)
MCH: 30.7 pg (ref 25.1–34.0)
MCHC: 34.7 g/dL (ref 31.5–36.0)
RBC: 3.66 10*6/uL — ABNORMAL LOW (ref 3.70–5.45)
RDW: 13.2 % (ref 11.2–14.5)
lymph#: 1 10*3/uL (ref 0.9–3.3)

## 2008-08-07 LAB — COMPREHENSIVE METABOLIC PANEL
AST: 21 U/L (ref 0–37)
Albumin: 3.6 g/dL (ref 3.5–5.2)
Alkaline Phosphatase: 51 U/L (ref 39–117)
Potassium: 3.4 mEq/L — ABNORMAL LOW (ref 3.5–5.3)
Sodium: 141 mEq/L (ref 135–145)
Total Bilirubin: 0.5 mg/dL (ref 0.3–1.2)
Total Protein: 6.3 g/dL (ref 6.0–8.3)

## 2008-08-07 LAB — RESEARCH LABS

## 2008-08-08 LAB — VITAMIN D 25 HYDROXY (VIT D DEFICIENCY, FRACTURES): Vit D, 25-Hydroxy: 22 ng/mL — ABNORMAL LOW (ref 30–89)

## 2008-08-08 LAB — CANCER ANTIGEN 27.29: CA 27.29: 17 U/mL (ref 0–39)

## 2008-08-09 ENCOUNTER — Ambulatory Visit: Payer: Self-pay | Admitting: Internal Medicine

## 2008-08-16 ENCOUNTER — Encounter: Payer: Self-pay | Admitting: Internal Medicine

## 2008-09-07 ENCOUNTER — Telehealth: Payer: Self-pay | Admitting: Internal Medicine

## 2008-09-07 DIAGNOSIS — I479 Paroxysmal tachycardia, unspecified: Secondary | ICD-10-CM | POA: Insufficient documentation

## 2008-09-08 ENCOUNTER — Ambulatory Visit: Payer: Self-pay | Admitting: Internal Medicine

## 2008-09-15 ENCOUNTER — Encounter (INDEPENDENT_AMBULATORY_CARE_PROVIDER_SITE_OTHER): Payer: Self-pay | Admitting: *Deleted

## 2008-09-18 ENCOUNTER — Ambulatory Visit: Payer: Self-pay | Admitting: Internal Medicine

## 2008-09-18 LAB — HM COLONOSCOPY

## 2008-11-17 ENCOUNTER — Ambulatory Visit: Payer: Self-pay | Admitting: Internal Medicine

## 2008-11-17 LAB — CONVERTED CEMR LAB: Vit D, 25-Hydroxy: 34 ng/mL (ref 30–89)

## 2008-11-20 ENCOUNTER — Encounter: Payer: Self-pay | Admitting: Internal Medicine

## 2008-11-21 LAB — CONVERTED CEMR LAB
ALT: 22 units/L (ref 0–35)
AST: 30 units/L (ref 0–37)
Albumin: 3.8 g/dL (ref 3.5–5.2)
BUN: 17 mg/dL (ref 6–23)
Bilirubin, Direct: 0.1 mg/dL (ref 0.0–0.3)
CO2: 30 meq/L (ref 19–32)
Cholesterol: 212 mg/dL — ABNORMAL HIGH (ref 0–200)
Creatinine, Ser: 0.6 mg/dL (ref 0.4–1.2)
Direct LDL: 132.5 mg/dL
Eosinophils Absolute: 0 10*3/uL (ref 0.0–0.7)
GFR calc non Af Amer: 112.63 mL/min (ref >60)
Glucose, Bld: 122 mg/dL — ABNORMAL HIGH (ref 70–99)
HDL: 56.4 mg/dL (ref >39.00)
Hemoglobin, Urine: NEGATIVE
Ketones, ur: NEGATIVE mg/dL
Lymphocytes Relative: 22.1 % (ref 12.0–46.0)
Lymphs Abs: 1 10*3/uL (ref 0.7–4.0)
Monocytes Relative: 7.2 % (ref 3.0–12.0)
RBC: 3.85 M/uL — ABNORMAL LOW (ref 3.87–5.11)
RDW: 12.6 % (ref 11.5–14.6)
Sed Rate: 10 mm/hr (ref 0–22)
TSH: 1.6 microintl units/mL (ref 0.35–5.50)
Total Bilirubin: 0.7 mg/dL (ref 0.3–1.2)
Total Protein: 6.5 g/dL (ref 6.0–8.3)
Urine Glucose: NEGATIVE mg/dL
Urobilinogen, UA: 0.2 (ref 0.0–1.0)
VLDL: 19 mg/dL (ref 0.0–40.0)
WBC: 4.4 10*3/uL — ABNORMAL LOW (ref 4.5–10.5)

## 2009-01-01 ENCOUNTER — Ambulatory Visit: Payer: Self-pay | Admitting: Internal Medicine

## 2009-01-01 DIAGNOSIS — M545 Low back pain, unspecified: Secondary | ICD-10-CM | POA: Insufficient documentation

## 2009-01-01 DIAGNOSIS — N3090 Cystitis, unspecified without hematuria: Secondary | ICD-10-CM | POA: Insufficient documentation

## 2009-01-04 LAB — CONVERTED CEMR LAB
CO2: 30 meq/L (ref 19–32)
Calcium: 9.3 mg/dL (ref 8.4–10.5)
Chloride: 110 meq/L (ref 96–112)
Creatinine, Ser: 0.5 mg/dL (ref 0.4–1.2)
GFR calc non Af Amer: 138.94 mL/min (ref >60)
Glucose, Bld: 84 mg/dL (ref 70–99)
Leukocytes, UA: NEGATIVE
Nitrite: NEGATIVE
Potassium: 4.6 meq/L (ref 3.5–5.1)
Sodium: 143 meq/L (ref 135–145)
Specific Gravity, Urine: 1.025 (ref 1.000–1.030)
pH: 6 (ref 5.0–8.0)

## 2009-01-17 ENCOUNTER — Encounter: Payer: Self-pay | Admitting: Internal Medicine

## 2009-02-27 ENCOUNTER — Ambulatory Visit: Payer: Self-pay | Admitting: Oncology

## 2009-03-16 ENCOUNTER — Ambulatory Visit: Payer: Self-pay | Admitting: Internal Medicine

## 2009-03-16 DIAGNOSIS — G43019 Migraine without aura, intractable, without status migrainosus: Secondary | ICD-10-CM | POA: Insufficient documentation

## 2009-03-19 ENCOUNTER — Ambulatory Visit: Payer: Self-pay | Admitting: Internal Medicine

## 2009-03-19 DIAGNOSIS — R51 Headache: Secondary | ICD-10-CM

## 2009-03-19 DIAGNOSIS — R519 Headache, unspecified: Secondary | ICD-10-CM | POA: Insufficient documentation

## 2009-03-20 ENCOUNTER — Telehealth (INDEPENDENT_AMBULATORY_CARE_PROVIDER_SITE_OTHER): Payer: Self-pay | Admitting: *Deleted

## 2009-03-29 ENCOUNTER — Encounter: Payer: Self-pay | Admitting: Internal Medicine

## 2009-03-30 ENCOUNTER — Encounter: Admission: RE | Admit: 2009-03-30 | Discharge: 2009-03-30 | Payer: Self-pay | Admitting: Internal Medicine

## 2009-04-27 ENCOUNTER — Ambulatory Visit (HOSPITAL_COMMUNITY): Admission: RE | Admit: 2009-04-27 | Discharge: 2009-04-27 | Payer: Self-pay | Admitting: Obstetrics & Gynecology

## 2009-05-18 ENCOUNTER — Ambulatory Visit: Payer: Self-pay | Admitting: Internal Medicine

## 2009-05-18 DIAGNOSIS — M79609 Pain in unspecified limb: Secondary | ICD-10-CM | POA: Insufficient documentation

## 2009-05-18 DIAGNOSIS — M25519 Pain in unspecified shoulder: Secondary | ICD-10-CM | POA: Insufficient documentation

## 2009-07-16 ENCOUNTER — Telehealth: Payer: Self-pay | Admitting: Internal Medicine

## 2009-07-17 ENCOUNTER — Ambulatory Visit: Payer: Self-pay | Admitting: Internal Medicine

## 2009-07-17 LAB — CONVERTED CEMR LAB
HCV Ab: NEGATIVE
Vit D, 25-Hydroxy: 33 ng/mL (ref 30–89)

## 2009-07-19 ENCOUNTER — Telehealth: Payer: Self-pay | Admitting: Internal Medicine

## 2009-08-24 ENCOUNTER — Telehealth: Payer: Self-pay | Admitting: Internal Medicine

## 2009-08-27 ENCOUNTER — Telehealth (INDEPENDENT_AMBULATORY_CARE_PROVIDER_SITE_OTHER): Payer: Self-pay | Admitting: *Deleted

## 2009-08-28 ENCOUNTER — Telehealth (INDEPENDENT_AMBULATORY_CARE_PROVIDER_SITE_OTHER): Payer: Self-pay | Admitting: *Deleted

## 2009-08-29 ENCOUNTER — Ambulatory Visit: Payer: Self-pay | Admitting: Cardiology

## 2009-08-29 ENCOUNTER — Encounter: Payer: Self-pay | Admitting: Internal Medicine

## 2009-08-29 ENCOUNTER — Ambulatory Visit: Payer: Self-pay

## 2009-08-29 ENCOUNTER — Encounter (HOSPITAL_COMMUNITY): Admission: RE | Admit: 2009-08-29 | Discharge: 2009-11-13 | Payer: Self-pay | Admitting: Internal Medicine

## 2009-08-29 ENCOUNTER — Encounter (INDEPENDENT_AMBULATORY_CARE_PROVIDER_SITE_OTHER): Payer: Self-pay | Admitting: *Deleted

## 2009-09-03 ENCOUNTER — Telehealth: Payer: Self-pay | Admitting: Internal Medicine

## 2009-09-03 DIAGNOSIS — R9431 Abnormal electrocardiogram [ECG] [EKG]: Secondary | ICD-10-CM | POA: Insufficient documentation

## 2009-09-05 ENCOUNTER — Encounter: Payer: Self-pay | Admitting: Cardiovascular Disease

## 2009-09-05 ENCOUNTER — Telehealth: Payer: Self-pay | Admitting: Internal Medicine

## 2009-09-05 ENCOUNTER — Ambulatory Visit: Payer: Self-pay | Admitting: Cardiovascular Disease

## 2009-09-05 ENCOUNTER — Encounter (INDEPENDENT_AMBULATORY_CARE_PROVIDER_SITE_OTHER): Payer: Self-pay | Admitting: *Deleted

## 2009-09-05 DIAGNOSIS — R9430 Abnormal result of cardiovascular function study, unspecified: Secondary | ICD-10-CM | POA: Insufficient documentation

## 2009-09-05 LAB — CONVERTED CEMR LAB
Basophils Absolute: 0 10*3/uL (ref 0.0–0.1)
Basophils Relative: 0.4 % (ref 0.0–3.0)
Calcium: 9.9 mg/dL (ref 8.4–10.5)
Chloride: 105 meq/L (ref 96–112)
Creatinine, Ser: 0.7 mg/dL (ref 0.4–1.2)
Eosinophils Absolute: 0 10*3/uL (ref 0.0–0.7)
Glucose, Bld: 103 mg/dL — ABNORMAL HIGH (ref 70–99)
HCT: 37.5 % (ref 36.0–46.0)
Hemoglobin: 13.1 g/dL (ref 12.0–15.0)
Lymphocytes Relative: 19.8 % (ref 12.0–46.0)
MCHC: 34.8 g/dL (ref 30.0–36.0)
Neutrophils Relative %: 71.1 % (ref 43.0–77.0)
Platelets: 192 10*3/uL (ref 150.0–400.0)
Prothrombin Time: 9.5 s (ref 9.1–11.7)
RBC: 4.21 M/uL (ref 3.87–5.11)
RDW: 13.5 % (ref 11.5–14.6)

## 2009-09-06 ENCOUNTER — Telehealth: Payer: Self-pay | Admitting: Internal Medicine

## 2009-09-11 ENCOUNTER — Ambulatory Visit (HOSPITAL_COMMUNITY): Admission: RE | Admit: 2009-09-11 | Discharge: 2009-09-11 | Payer: Self-pay | Admitting: Cardiology

## 2009-09-11 ENCOUNTER — Ambulatory Visit: Payer: Self-pay | Admitting: Cardiology

## 2009-09-11 ENCOUNTER — Telehealth: Payer: Self-pay | Admitting: Cardiovascular Disease

## 2009-09-12 ENCOUNTER — Encounter (INDEPENDENT_AMBULATORY_CARE_PROVIDER_SITE_OTHER): Payer: Self-pay | Admitting: *Deleted

## 2009-09-20 ENCOUNTER — Ambulatory Visit: Payer: Self-pay | Admitting: Cardiovascular Disease

## 2009-09-20 DIAGNOSIS — R0989 Other specified symptoms and signs involving the circulatory and respiratory systems: Secondary | ICD-10-CM | POA: Insufficient documentation

## 2009-09-21 ENCOUNTER — Encounter: Payer: Self-pay | Admitting: Cardiovascular Disease

## 2009-09-21 ENCOUNTER — Ambulatory Visit: Payer: Self-pay

## 2009-10-04 ENCOUNTER — Encounter (INDEPENDENT_AMBULATORY_CARE_PROVIDER_SITE_OTHER): Payer: Self-pay | Admitting: *Deleted

## 2009-10-04 ENCOUNTER — Telehealth: Payer: Self-pay | Admitting: Cardiovascular Disease

## 2009-10-19 ENCOUNTER — Telehealth: Payer: Self-pay | Admitting: Internal Medicine

## 2009-10-22 ENCOUNTER — Encounter: Payer: Self-pay | Admitting: Internal Medicine

## 2009-10-26 ENCOUNTER — Ambulatory Visit: Payer: Self-pay | Admitting: Internal Medicine

## 2009-10-26 DIAGNOSIS — M26629 Arthralgia of temporomandibular joint, unspecified side: Secondary | ICD-10-CM | POA: Insufficient documentation

## 2009-10-26 DIAGNOSIS — B353 Tinea pedis: Secondary | ICD-10-CM | POA: Insufficient documentation

## 2009-10-30 ENCOUNTER — Ambulatory Visit: Payer: Self-pay

## 2009-10-30 ENCOUNTER — Encounter: Payer: Self-pay | Admitting: Cardiovascular Disease

## 2009-11-01 ENCOUNTER — Encounter: Payer: Self-pay | Admitting: Cardiovascular Disease

## 2010-02-09 ENCOUNTER — Telehealth: Payer: Self-pay | Admitting: Internal Medicine

## 2010-03-27 ENCOUNTER — Telehealth: Payer: Self-pay | Admitting: Internal Medicine

## 2010-04-01 ENCOUNTER — Encounter: Payer: Self-pay | Admitting: Internal Medicine

## 2010-05-09 ENCOUNTER — Ambulatory Visit: Admit: 2010-05-09 | Payer: Self-pay | Admitting: Internal Medicine

## 2010-06-02 ENCOUNTER — Encounter: Payer: Self-pay | Admitting: Gynecology

## 2010-06-02 ENCOUNTER — Encounter: Payer: Self-pay | Admitting: Internal Medicine

## 2010-06-03 ENCOUNTER — Encounter: Payer: Self-pay | Admitting: Gynecology

## 2010-06-03 ENCOUNTER — Ambulatory Visit
Admission: RE | Admit: 2010-06-03 | Discharge: 2010-06-03 | Payer: Self-pay | Source: Home / Self Care | Attending: Internal Medicine | Admitting: Internal Medicine

## 2010-06-03 DIAGNOSIS — Z91018 Allergy to other foods: Secondary | ICD-10-CM | POA: Insufficient documentation

## 2010-06-03 DIAGNOSIS — M25529 Pain in unspecified elbow: Secondary | ICD-10-CM | POA: Insufficient documentation

## 2010-06-09 LAB — CONVERTED CEMR LAB
Albumin: 3.9 g/dL (ref 3.5–5.2)
Alkaline Phosphatase: 72 units/L (ref 39–117)
Bilirubin Urine: NEGATIVE
Bilirubin, Direct: 0.1 mg/dL (ref 0.0–0.3)
CO2: 28 meq/L (ref 19–32)
Chloride: 105 meq/L (ref 96–112)
Eosinophils Absolute: 0.1 10*3/uL (ref 0.0–0.7)
Eosinophils Relative: 1 % (ref 0.0–5.0)
Ketones, ur: NEGATIVE mg/dL
Leukocytes, UA: NEGATIVE
Lymphs Abs: 1.2 10*3/uL (ref 0.7–4.0)
Monocytes Absolute: 0.4 10*3/uL (ref 0.1–1.0)
Monocytes Relative: 7.3 % (ref 3.0–12.0)
Neutro Abs: 3.5 10*3/uL (ref 1.4–7.7)
Nitrite: NEGATIVE
RBC: 3.92 M/uL (ref 3.87–5.11)
RDW: 12.6 % (ref 11.5–14.6)
Specific Gravity, Urine: 1.02 (ref 1.000–1.030)
Total Bilirubin: 0.6 mg/dL (ref 0.3–1.2)
Total Protein, Urine: NEGATIVE mg/dL
Total Protein: 6.7 g/dL (ref 6.0–8.3)
WBC: 5.2 10*3/uL (ref 4.5–10.5)
pH: 6 (ref 5.0–8.0)

## 2010-06-10 ENCOUNTER — Telehealth: Payer: Self-pay | Admitting: Internal Medicine

## 2010-06-10 ENCOUNTER — Other Ambulatory Visit: Payer: Self-pay | Admitting: Internal Medicine

## 2010-06-10 ENCOUNTER — Ambulatory Visit
Admission: RE | Admit: 2010-06-10 | Discharge: 2010-06-10 | Payer: Self-pay | Source: Home / Self Care | Attending: Internal Medicine | Admitting: Internal Medicine

## 2010-06-10 ENCOUNTER — Ambulatory Visit: Payer: Self-pay | Admitting: Hematology & Oncology

## 2010-06-10 ENCOUNTER — Encounter: Payer: Self-pay | Admitting: Internal Medicine

## 2010-06-10 LAB — IBC PANEL
Iron: 111 ug/dL (ref 42–145)
Saturation Ratios: 26.9 % (ref 20.0–50.0)
Transferrin: 294.2 mg/dL (ref 212.0–360.0)

## 2010-06-10 LAB — CONVERTED CEMR LAB: Vit D, 25-Hydroxy: 42 ng/mL (ref 30–89)

## 2010-06-10 LAB — BASIC METABOLIC PANEL
CO2: 28 mEq/L (ref 19–32)
Calcium: 9.2 mg/dL (ref 8.4–10.5)
Sodium: 139 mEq/L (ref 135–145)

## 2010-06-10 LAB — URINALYSIS, ROUTINE W REFLEX MICROSCOPIC
Ketones, ur: NEGATIVE
Specific Gravity, Urine: 1.015 (ref 1.000–1.030)
Total Protein, Urine: NEGATIVE
Urine Glucose: NEGATIVE
pH: 6 (ref 5.0–8.0)

## 2010-06-10 LAB — LIPID PANEL
HDL: 51.1 mg/dL (ref >39.00)
Total CHOL/HDL Ratio: 5
VLDL: 32.8 mg/dL (ref 0.0–40.0)

## 2010-06-10 LAB — HEPATIC FUNCTION PANEL
Alkaline Phosphatase: 68 U/L (ref 39–117)
Bilirubin, Direct: 0.1 mg/dL (ref 0.0–0.3)
Total Protein: 6.6 g/dL (ref 6.0–8.3)

## 2010-06-10 LAB — TSH: TSH: 3.76 u[IU]/mL (ref 0.35–5.50)

## 2010-06-10 LAB — CBC WITH DIFFERENTIAL/PLATELET
Eosinophils Relative: 0.7 % (ref 0.0–5.0)
HCT: 34.9 % — ABNORMAL LOW (ref 36.0–46.0)
Lymphocytes Relative: 26.3 % (ref 12.0–46.0)
Monocytes Relative: 6.1 % (ref 3.0–12.0)
Neutrophils Relative %: 66.4 % (ref 43.0–77.0)
Platelets: 180 10*3/uL (ref 150.0–400.0)
WBC: 4.6 10*3/uL (ref 4.5–10.5)

## 2010-06-10 LAB — LDL CHOLESTEROL, DIRECT: Direct LDL: 158.9 mg/dL

## 2010-06-13 ENCOUNTER — Encounter: Payer: Self-pay | Admitting: Internal Medicine

## 2010-06-13 NOTE — Letter (Signed)
Summary: [employer], Main Office  [address]   [place], [place] [address]   Phone: [phone]  Fax: [phone]     Sep 21, 2009    [facility]   The above named patient had a medical visit today at: 3 pm.  Please take this into consideration when reviewing the time away from work/school.      Sincerely yours,  Architectural technologist

## 2010-06-13 NOTE — Letter (Signed)
Summary: Generic Letter  [facility]  [address] [place], [place] [address]   Phone: [phone]  Fax: [phone]    04/01/2010  Sarah Waller [address] [place], [place]  [address]  To whom it may concern  This above named patient is unable to have an annual flu vaccine due to her allergy to eggs and allergy to the flu vaccine. Please contact our office with any questions.     Sincerely,   Lamar Sprinkles, CMA (AAMA) for A. Plotnikov M.D.

## 2010-06-13 NOTE — Progress Notes (Signed)
  Phone Note Other Incoming   Caller: pt Summary of Call: Pt called and stated that she recieved a call directly from Dr Macario Golds. Pt states she can be reached between 1 and 2 at [phone] Initial call taken by: Ami Bullins CMA,  September 03, 2009 10:51 AM  Follow-up for Phone Call        Spoke w/pt in the office. No CP . C/o tachy w/exercise w/o other symptoms x 2 years plus. Take ASA 81 mg once daily  Follow-up by: Tresa Garter MD,  September 03, 2009 11:23 AM  New Problems: ABNORMAL ELECTROCARDIOGRAM (ICD-794.31)   New Problems: ABNORMAL ELECTROCARDIOGRAM (ICD-794.31) New/Updated Medications: ASPIRIN 81 MG TBEC (ASPIRIN) 1 by mouth qd

## 2010-06-13 NOTE — Progress Notes (Signed)
Summary: REFERRAL - Stress test  Phone Note Call from Patient Call back at Home Phone [phone] Call back at Work Phone [phone]   Summary of Call: Patient is requesting referral for stress test. Says that last year this was ordered but she was not able to do it and now she is ready.  Initial call taken by: Lamar Sprinkles, CMA,  August 24, 2009 2:19 PM  Follow-up for Phone Call        ok Follow-up by: Tresa Garter MD,  August 24, 2009 6:11 PM

## 2010-06-13 NOTE — Letter (Signed)
Summary: Return To Work  [employer], Main Office  [address]   [place], [place] [address]   Phone: [phone]  Fax: [phone]    09/12/2009  TO: WHOM IT MAY CONCERN   RE: Sarah Waller [address] [place]   The above named individual is under my medical care and may return to work IE:PPIRJJ 09-17-09 WITH NO RESTRICTIONS  If you have any further questions or need additional information, please call.     Sincerely, Deliah Goody, RN/Dr Charlton Haws

## 2010-06-13 NOTE — Progress Notes (Signed)
Summary: REQ A CALL  Phone Note Call from Patient Call back at Work Phone [phone]   Summary of Call: Patient is requesting a call from MD regarding cardiologist consult.  Initial call taken by: Lamar Sprinkles, CMA,  September 06, 2009 1:49 PM  Follow-up for Phone Call        Called - left a VM Follow-up by: Tresa Garter MD,  September 07, 2009 3:36 PM

## 2010-06-13 NOTE — Miscellaneous (Signed)
Summary: Special Procedure consent/[facility]  Special Procedure consent/[facility]   Imported By: [place] 05/24/2009 09:44:45  _____________________________________________________________________  External Attachment:    Type:   Image     Comment:   External Document

## 2010-06-13 NOTE — Assessment & Plan Note (Signed)
Summary: shoulder discomfort d/t  stc   Vital Signs:  Patient profile:   50 year old female Height:      64 inches (162.56 cm) Weight:      178 pounds (80.91 kg) BMI:     30.66 O2 Sat:      98 % on Room air Temp:     98.5 degrees F (36.94 degrees C) oral Pulse rate:   92 / minute BP sitting:   140 / 92  (left arm) Cuff size:   regular  Vitals Entered By: Tora Perches (May 18, 2009 4:19 PM)  O2 Flow:  Room air  Procedure Note  Injections: The patient complains of pain and tenderness. Indication: chronic pain  Procedure # 1: trigger point injection    Region: anterior    Location: R deltoid    Technique: 24 g needle    Medication: 20 mg depomedrol    Anesthesia: 2.0 ml 1% lidocaine w/o epinephrine    Comment: Risks including but not limited by incomplete procedure, bleeding, infection, recurrence were discussed with the patient. Consent form was signed.   Cleaned and prepped with: alcohol and betadine Wound dressing: bandaid Additional Instructions: Tol well. Compl none  CC: shoulder discomfort Is Patient Diabetic? No   Primary Care Provider:  Jacinta Shoe, MD  CC:  shoulder discomfort.  History of Present Illness: C/o R shoulder pain C/o R heel pain  Allergies: 1)  ! Vicodin 2)  Minocin (Minocycline Hcl)  Past History:  Past Medical History: Last updated: 04/21/2008 Breast cancer, hx of  MIGRAINES, HX OF (ICD-V13.8) IRRITABLE BOWEL SYNDROME (ICD-564.1) VOMITING (ICD-787.03) FLATULENCE (ICD-787.3) EXPOSURE TO HAZARDOUS BODY FLUID, HX OF (ICD-V15.85) BREAST CANCER, HX OF (ICD-V10.3) RASH AND OTHER NONSPECIFIC SKIN ERUPTION (ICD-782.1) Hx of PROCTITIS (ICD-569.49) Hx of PEPTIC ULCER DISEASE, HELICOBACTER PYLORI POSITIVE (ICD-533.90) HIATAL HERNIA (ICD-553.3) ALLERGIC RHINITIS (ICD-477.9) SWEATING (ICD-780.8) CHEST WALL PAIN (ICD-786.52) BREAST CANCER-NOS (ICD-174.9) VERTIGO (ICD-780.4) WEIGHT GAIN (ICD-783.1) NECK PAIN (ICD-723.1) GERD  (ICD-530.81) ASTHMA (ICD-493.90)  Social History: Last updated: 04/26/2008 Occupation: Dental Hygenist Married Never Smoked Alcohol Use -occ Daily Caffeine Use -1 Illicit Drug Use - no Patient does not get regular exercise.   Physical Exam  General:  Looks tired  overweight-appearing. Dry heaving.  Mouth:  WNL Lungs:  CTA Heart:  RRR Msk:  R deltoid anter point of tenderness located R AC and subacr joint wnl Neurologic:  No cranial nerve deficits noted. Station and gait are normal. Plantar reflexes are down-going bilaterally. DTRs are symmetrical throughout. Sensory, motor and coordinative functions appear intact. Skin:  WNL Psych:  Oriented X3.     Impression & Recommendations:  Problem # 1:  SHOULDER PAIN (ICD-719.41) R MSK Assessment New Options discussed. She elected injection Her updated medication list for this problem includes:    Ibuprofen 600 Mg Tabs (Ibuprofen) .Marland Kitchen... As needed    Meloxicam 15 Mg Tabs (Meloxicam) ..... One by mouth daily pc prn    Fioricet 50-325-40 Mg Tabs (Butalbital-apap-caffeine) .Marland Kitchen... 1-2 by mouth two times a day as needed headache  Orders: Trigger Point Injection (1 or 2 muscles) (60454) Depo-Medrol 20mg  (J1020)  Complete Medication List: 1)  Claritin 10 Mg Tabs (Loratadine) .... As needed 2)  Ibuprofen 600 Mg Tabs (Ibuprofen) .... As needed 3)  Vitamin D3 1000 Unit Tabs (Cholecalciferol) .Marland Kitchen.. 1 by mouth daily 4)  Promethazine Hcl 25 Mg Supp (Promethazine hcl) .Marland Kitchen.. 1 pr as needed nausea q 4 h 5)  Allegra-d 24 Hour 180-240 Mg Tb24 (Fexofenadine-pseudoephedrine) .Marland KitchenMarland KitchenMarland Kitchen  1 by mouth qd 6)  Tamoxifen Citrate 20 Mg Tabs (Tamoxifen citrate) .... Once daily 7)  Vitamin D3 1000 Unit Tabs (Cholecalciferol) .Marland Kitchen.. 1 by mouth daily 8)  Zegerid 40-1100 Mg Caps (Omeprazole-sodium bicarbonate) .... Take 1 tablet by mouth once a day 9)  Advair Diskus 100-50 Mcg/dose Misc (Fluticasone-salmeterol) .Marland Kitchen.. 1 puff bid 10)  Proair Hfa 108 (90 Base) Mcg/act Aers  (Albuterol sulfate) .... 2 inh q4h as needed shortness of breath 11)  Triamcinolone Acetonide 0.5 % Crea (Triamcinolone acetonide) .... Apply bid to affected area 12)  Lyrica 75 Mg Caps (Pregabalin) .Marland Kitchen.. 1 by mouth tid 13)  Levsin 0.125 Mg Tabs (Hyoscyamine sulfate) .Marland Kitchen.. 1-2 by mouth qid prn 14)  Meloxicam 15 Mg Tabs (Meloxicam) .... One by mouth daily pc prn 15)  Maxalt-mlt 5 Mg Tbdp (Rizatriptan benzoate) .Marland Kitchen.. 1 by mouth once daily prn 16)  Promethazine Hcl 25 Mg Tabs (Promethazine hcl) .Marland Kitchen.. 1-2 by mouth qid as needed nausea 17)  Sumavel Dosepro 6 Mg/0.40ml Devi (Sumatriptan succinate) .Marland Kitchen.. 1 as dirr once daily as needed migraine 18)  Fioricet 50-325-40 Mg Tabs (Butalbital-apap-caffeine) .Marland Kitchen.. 1-2 by mouth two times a day as needed headache  Patient Instructions: 1)  Call if you are not better in a reasonable amount of time or if worse 2)  Get a flu shot

## 2010-06-13 NOTE — Progress Notes (Signed)
Summary: LABS  Phone Note Call from Patient Call back at Work Phone [phone]   Summary of Call: Patient is requesting for labs to go to Dr Shirley Friar(?) her oncologist [phone] fax [phone]). She is req a call when complete.  Initial call taken by: Lamar Sprinkles, CMA,  October 19, 2009 9:58 AM  Follow-up for Phone Call        Spoke with patient to confirm Oncologist Name: Dr. Alvin Critchley.   Her appointment is Monday June 13th at 3:15pm. Dr. Posey Rea is it okay to fax the results? Cristy Hilts, RN  October 19, 2009 4:08 PM   Additional Follow-up for Phone Call Additional follow up Details #1::        Shure. Thx! Additional Follow-up by: Tresa Garter MD,  October 19, 2009 5:29 PM    Additional Follow-up for Phone Call Additional follow up Details #2::    Faxed Follow-up by: Lamar Sprinkles, CMA,  October 19, 2009 6:16 PM

## 2010-06-13 NOTE — Assessment & Plan Note (Signed)
Summary: eph/jss      Allergies Added:   Primary Provider:  Jacinta Shoe, MD   History of Present Illness: Sarah Waller is seen today post cath.  She had relative tachycardia with a myovue suggesting anterior ischemia.  She had a normal cath.  She did have a lot of bruising at her cath site.  She is no longer taking Plavix.  She has normal LV function.  I dont think she needs further cardiac w.u.  She did have a lot of brusing in her leg and has a residual hematima and bruit.  She needs a duplex to R/O pseudoaneurysm.  I told her in the future if she needs a cardiac w/u that a stress echo or cardiac CT would be better since she appears to have had a false positive myouve  Current Problems (verified): 1)  Nonspecific Abnormal Unspec Cv Function Study  (ICD-794.30) 2)  Chest Wall Pain  (ICD-786.52) 3)  Abnormal Electrocardiogram  (ICD-794.31) 4)  Foot Pain  (ICD-729.5) 5)  Shoulder Pain  (ICD-719.41) 6)  Headache  (ICD-784.0) 7)  Migraine, Common W/intractable Migraine  (ICD-346.11) 8)  Cystitis  (ICD-595.9) 9)  Low Back Pain  (ICD-724.2) 10)  Paroxysmal Tachycardia  (ICD-427.2) 11)  Migraines, Hx of  (ICD-V13.8) 12)  Irritable Bowel Syndrome  (ICD-564.1) 13)  Vomiting  (ICD-787.03) 14)  Flatulence  (ICD-787.3) 15)  Exposure To Hazardous Body Fluid, Hx of  (ICD-V15.85) 16)  Breast Cancer, Hx of  (ICD-V10.3) 17)  Rash and Other Nonspecific Skin Eruption  (ICD-782.1) 18)  Hx of Proctitis  (ICD-569.49) 19)  Hx of Peptic Ulcer Disease, Helicobacter Pylori Positive  (ICD-533.90) 20)  Hiatal Hernia  (ICD-553.3) 21)  Allergic Rhinitis  (ICD-477.9) 22)  Sweating  (ICD-780.8) 23)  Breast Cancer-nos  (ICD-174.9) 24)  Vertigo  (ICD-780.4) 25)  Weight Gain  (ICD-783.1) 26)  Neck Pain  (ICD-723.1) 27)  Gerd  (ICD-530.81) 28)  Asthma  (ICD-493.90)  Current Medications (verified): 1)  Claritin 10 Mg  Tabs (Loratadine) .... As Needed 2)  Ibuprofen 600 Mg  Tabs (Ibuprofen) .... As Needed 3)   Vitamin D3 1000 Unit  Tabs (Cholecalciferol) .Marland Kitchen.. 1 By Mouth Daily 4)  Allegra-D 24 Hour 180-240 Mg  Tb24 (Fexofenadine-Pseudoephedrine) .Marland Kitchen.. 1 By Mouth Qd 5)  Tamoxifen Citrate 20 Mg  Tabs (Tamoxifen Citrate) .... Once Daily 6)  Zegerid 40-1100 Mg  Caps (Omeprazole-Sodium Bicarbonate) .... Take 1 Tablet By Mouth Once A Day 7)  Advair Diskus 100-50 Mcg/dose Misc (Fluticasone-Salmeterol) .Marland Kitchen.. 1 Puff Bid 8)  Proair Hfa 108 (90 Base) Mcg/act  Aers (Albuterol Sulfate) .... 2 Inh Q4h As Needed Shortness of Breath 9)  Triamcinolone Acetonide 0.5 % Crea (Triamcinolone Acetonide) .... Apply Bid To Affected Area 10)  Levsin 0.125 Mg Tabs (Hyoscyamine Sulfate) .Marland Kitchen.. 1-2 By Mouth Qid Prn 11)  Aspirin 81 Mg Tbec (Aspirin) .Marland Kitchen.. 1 By Mouth Qd  Allergies (verified): 1)  ! Vicodin 2)  Minocin (Minocycline Hcl)  Past History:  Past Medical History: Last updated: 09/04/2009 Current Problems:  CHEST WALL PAIN (ICD-786.52) ABNORMAL ELECTROCARDIOGRAM (ICD-794.31) FOOT PAIN (ICD-729.5) SHOULDER PAIN (ICD-719.41) HEADACHE (ICD-784.0) MIGRAINE, COMMON W/INTRACTABLE MIGRAINE (ICD-346.11) CYSTITIS (ICD-595.9) LOW BACK PAIN (ICD-724.2) PAROXYSMAL TACHYCARDIA (ICD-427.2) MIGRAINES, HX OF (ICD-V13.8) IRRITABLE BOWEL SYNDROME (ICD-564.1) VOMITING (ICD-787.03) FLATULENCE (ICD-787.3) EXPOSURE TO HAZARDOUS BODY FLUID, HX OF (ICD-V15.85) BREAST CANCER, HX OF (ICD-V10.3) RASH AND OTHER NONSPECIFIC SKIN ERUPTION (ICD-782.1) Hx of PROCTITIS (ICD-569.49) Hx of PEPTIC ULCER DISEASE, HELICOBACTER PYLORI POSITIVE (ICD-533.90) HIATAL HERNIA (ICD-553.3) ALLERGIC RHINITIS (ICD-477.9) SWEATING (ICD-780.8) BREAST  CANCER-NOS (ICD-174.9) VERTIGO (ICD-780.4) WEIGHT GAIN (ICD-783.1) NECK PAIN (ICD-723.1) GERD (ICD-530.81) ASTHMA (ICD-493.90)  Past Surgical History: Last updated: 09/04/2009 Lumpectomy subcutaneous tumor removal of right thigh Hysteroscopy, D&C.   Family History: Last updated: 04/21/2008 Family  History Hypertension No FH of Colon Cancer:  Social History: Last updated: 04/26/2008 Occupation: Dental Hygenist Married Never Smoked Alcohol Use -occ Daily Caffeine Use -1 Illicit Drug Use - no Patient does not get regular exercise.   Review of Systems       Denies fever, malais, weight loss, blurry vision, decreased visual acuity, cough, sputum, SOB, hemoptysis, pleuritic pain, palpitaitons, heartburn, abdominal pain, melena, lower extremity edema, claudication, or rash. Bruising in right thigh at cath site  Vital Signs:  Patient profile:   50 year old female Height:      64 inches Weight:      180 pounds BMI:     31.01 Pulse rate:   90 / minute Resp:     14 per minute BP sitting:   118 / 80  (left arm)  Vitals Entered By: Kem Parkinson (Sep 20, 2009 4:42 PM)  Physical Exam  General:  Affect appropriate Healthy:  appears stated age HEENT: normal Neck supple with no adenopathy JVP normal no bruits no thyromegaly Lungs clear with no wheezing and good diaphragmatic motion Heart:  S1/S2 no murmur,rub, gallop or click PMI normal Abdomen: benighn, BS positve, no tenderness, no AAA no bruit.  No HSM or HJR Distal pulses intact with no bruits No edema Neuro non-focal Skin warm and dry Right groin with hematoma and bruit with soft echymosis on inner thigh   Impression & Recommendations:  Problem # 1:  NONSPECIFIC ABNORMAL UNSPEC CV FUNCTION STUDY (ICD-794.30) Abnormal myouve with normal cath.  No further w/u needed Orders: Arterial Duplex Lower Extremity (Arterial Duplex Low)  Problem # 2:  PAROXYSMAL TACHYCARDIA (ICD-427.2) Related to anxiety.  as needed BB The following medications were removed from the medication list:    Plavix 75 Mg Tabs (Clopidogrel bisulfate) .Marland Kitchen... Take two tablets by mouth x two days then take one tablet by mouth once daily Her updated medication list for this problem includes:    Aspirin 81 Mg Tbec (Aspirin) .Marland Kitchen... 1 by mouth  qd  Problem # 3:  FEMORAL BRUIT, RIGHT (ICD-785.9) High risk for pseudoaneurysm.  F/U duplex tomorrow.    Patient Instructions: 1)  Your physician recommends that you continue on your current medications as directed. Please refer to the Current Medication list given to you today. 2)  Your physician has requested that you have a lower or upper extremity arterial duplex.  This test is an ultrasound of the arteries in the legs or arms.  It looks at arterial blood flow in the legs and arms.  Allow one hour for Lower and Upper Arterial scans. There are no restrictions or special instructions. 3)  Your physician recommends that you schedule a follow-up appointment in: as needed with dr. Eden Emms

## 2010-06-13 NOTE — Progress Notes (Signed)
  Phone Note Other Incoming   Summary of Call: Walked in - pricked finger on contam. instrument at work. 2 mm scratch on L middle finger  Follow-up for Phone Call        Will get labs - see orderes Follow-up by: Tresa Garter MD,  July 16, 2009 6:04 PM  Additional Follow-up for Phone Call Additional follow up Details #1::        Pt is aware, order in idx.  Additional Follow-up by: Lamar Sprinkles, CMA,  July 17, 2009 8:19 AM

## 2010-06-13 NOTE — Progress Notes (Signed)
----   Converted from flag ---- ---- 08/27/2009 9:10 AM, Edman Circle wrote: appt 4/20 @ 7:30 NPO 4 hours no smoking or caffeine 12 hours  ---- 08/27/2009 9:08 AM, Dagoberto Reef wrote: Thanks  ---- 08/27/2009 8:10 AM, Dagoberto Reef wrote:   ---- 08/24/2009 6:12 PM, Georgina Quint Plotnikov MD wrote: The following orders have been entered for this patient and placed on Admin Hold:  Type:     Referral       Code:   Cardiolite Description:   Cardiolite Order Date:   08/24/2009   Authorized By:   Tresa Garter MD Order #:   [id number] Clinical Notes:   Special Instructions: CL stress test Dx CP, L shoulder pain, tachycardia ------------------------------

## 2010-06-13 NOTE — Progress Notes (Signed)
Summary: SOONER APP   Phone Note From Other Clinic Call back at [phone]   Caller: Patient Call For: Sarah Waller Caller: Referral Coordinator [facility] Call For: Trang Bouse Reason for Call: Schedule Patient Appt Summary of Call: DR PLOTIKOV WOULD LIKE PT TO BE SEEN SOONER THAN 12-16 DUE TO NAUSEA LLQ ABD PAIN AND VOMITTING Initial call taken by: Tawni Levy,  March 24, 2008 8:56 AM  Follow-up for Phone Call        Spoke with Corrie Dandy at  Dr Plotinikov office,  patient  needed appointment sooner than 12/16 for llq abd pain, bloating, vomiting.  Appointment given for 03/31/08 at 3pm.  PCP office will call patient.  Will tell patient to bring updated med list, insurance card, and copay.  Will call if needed. Follow-up by: Paulene Floor, RN,  March 24, 2008 9:42 AM

## 2010-06-13 NOTE — Assessment & Plan Note (Signed)
Summary: np3/abn ekg  Medications Added PLAVIX 75 MG TABS (CLOPIDOGREL BISULFATE) Take two tablets by mouth x two days then take one tablet by mouth once daily      Allergies Added:   Primary Provider:  Jacinta Shoe, MD  CC:  no complaints.  History of Present Illness: Sarah Waller is seen today at the request of Dr Posey Rea for positive myovue.  The patient denies SSCP, dyspnea or syncope.  She has noted for a while that her HR is unusually high for the level of exercise that she does.  There is no associated diaphoresis.  Her exercise capacity has also diminished recently.  I reviewed her myouve and there is a fairly significan area of anteroapical ischemia.  I reviewed her test with her and called her son who is a cardiothoracic PA student in [place].  The risks of cath including stroke, contrast allergy, bleeding and emergency CABG were discussed.  She initially wanted to have the cath next Thursday.  This was arranged and then apparantly after discussion with her husband she wanted it sooner with Dr Juanda Chance as she knows D[facility].  I will try to expedite this and since there is a high likelyhood of intervention we will set her up in the inpatient lab.  She will be started on Plavix.  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   Current Problems (verified): 1)  Nonspecific Abnormal Unspec Cv Function Study  (ICD-794.30) 2)  Chest Wall Pain  (ICD-786.52) 3)  Abnormal Electrocardiogram  (ICD-794.31) 4)  Foot Pain  (ICD-729.5) 5)  Shoulder Pain  (ICD-719.41) 6)  Headache  (ICD-784.0) 7)  Migraine, Common W/intractable Migraine  (ICD-346.11) 8)  Cystitis  (ICD-595.9) 9)  Low Back Pain  (ICD-724.2) 10)  Paroxysmal Tachycardia  (ICD-427.2) 11)  Migraines, Hx of  (ICD-V13.8) 12)  Irritable Bowel Syndrome  (ICD-564.1) 13)  Vomiting  (ICD-787.03) 14)  Flatulence  (ICD-787.3) 15)  Exposure To Hazardous Body Fluid, Hx of  (ICD-V15.85) 16)  Breast Cancer,  Hx of  (ICD-V10.3) 17)  Rash and Other Nonspecific Skin Eruption  (ICD-782.1) 18)  Hx of Proctitis  (ICD-569.49) 19)  Hx of Peptic Ulcer Disease, Helicobacter Pylori Positive  (ICD-533.90) 20)  Hiatal Hernia  (ICD-553.3) 21)  Allergic Rhinitis  (ICD-477.9) 22)  Sweating  (ICD-780.8) 23)  Breast Cancer-nos  (ICD-174.9) 24)  Vertigo  (ICD-780.4) 25)  Weight Gain  (ICD-783.1) 26)  Neck Pain  (ICD-723.1) 27)  Gerd  (ICD-530.81) 28)  Asthma  (ICD-493.90)  Current Medications (verified): 1)  Claritin 10 Mg  Tabs (Loratadine) .... As Needed 2)  Ibuprofen 600 Mg  Tabs (Ibuprofen) .... As Needed 3)  Vitamin D3 1000 Unit  Tabs (Cholecalciferol) .Marland Kitchen.. 1 By Mouth Daily 4)  Promethazine Hcl 25 Mg Supp (Promethazine Hcl) .Marland Kitchen.. 1 Pr As Needed Nausea Q 4 H 5)  Allegra-D 24 Hour 180-240 Mg  Tb24 (Fexofenadine-Pseudoephedrine) .Marland Kitchen.. 1 By Mouth Qd 6)  Tamoxifen Citrate 20 Mg  Tabs (Tamoxifen Citrate) .... Once Daily 7)  Zegerid 40-1100 Mg  Caps (Omeprazole-Sodium Bicarbonate) .... Take 1 Tablet By Mouth Once A Day 8)  Advair Diskus 100-50 Mcg/dose Misc (Fluticasone-Salmeterol) .Marland Kitchen.. 1 Puff Bid 9)  Proair Hfa 108 (90 Base) Mcg/act  Aers (Albuterol Sulfate) .... 2 Inh Q4h As Needed Shortness of Breath 10)  Triamcinolone Acetonide 0.5 % Crea (Triamcinolone Acetonide) .... Apply Bid To Affected Area 11)  Levsin 0.125 Mg Tabs (Hyoscyamine Sulfate) .Marland Kitchen.. 1-2 By Mouth  Qid Prn 12)  Meloxicam 15 Mg Tabs (Meloxicam) .... One By Mouth Daily Pc Prn 13)  Maxalt-Mlt 5 Mg Tbdp (Rizatriptan Benzoate) .Marland Kitchen.. 1 By Mouth Once Daily Prn 14)  Fioricet 50-325-40 Mg Tabs (Butalbital-Apap-Caffeine) .Marland Kitchen.. 1-2 By Mouth Two Times A Day As Needed Headache 15)  Aspirin 81 Mg Tbec (Aspirin) .Marland Kitchen.. 1 By Mouth Qd 16)  Plavix 75 Mg Tabs (Clopidogrel Bisulfate) .... Take Two Tablets By Mouth X Two Days Then Take One Tablet By Mouth Once Daily  Allergies (verified): 1)  ! Vicodin 2)  Minocin (Minocycline Hcl)  Past History:  Past Medical  History: Last updated: 09/04/2009 Current Problems:  CHEST WALL PAIN (ICD-786.52) ABNORMAL ELECTROCARDIOGRAM (ICD-794.31) FOOT PAIN (ICD-729.5) SHOULDER PAIN (ICD-719.41) HEADACHE (ICD-784.0) MIGRAINE, COMMON W/INTRACTABLE MIGRAINE (ICD-346.11) CYSTITIS (ICD-595.9) LOW BACK PAIN (ICD-724.2) PAROXYSMAL TACHYCARDIA (ICD-427.2) MIGRAINES, HX OF (ICD-V13.8) IRRITABLE BOWEL SYNDROME (ICD-564.1) VOMITING (ICD-787.03) FLATULENCE (ICD-787.3) EXPOSURE TO HAZARDOUS BODY FLUID, HX OF (ICD-V15.85) BREAST CANCER, HX OF (ICD-V10.3) RASH AND OTHER NONSPECIFIC SKIN ERUPTION (ICD-782.1) Hx of PROCTITIS (ICD-569.49) Hx of PEPTIC ULCER DISEASE, HELICOBACTER PYLORI POSITIVE (ICD-533.90) HIATAL HERNIA (ICD-553.3) ALLERGIC RHINITIS (ICD-477.9) SWEATING (ICD-780.8) BREAST CANCER-NOS (ICD-174.9) VERTIGO (ICD-780.4) WEIGHT GAIN (ICD-783.1) NECK PAIN (ICD-723.1) GERD (ICD-530.81) ASTHMA (ICD-493.90)  Past Surgical History: Last updated: 09/04/2009 Lumpectomy subcutaneous tumor removal of right thigh Hysteroscopy, D&C.   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   Review of Systems       Denies fever, malais, weight loss, blurry vision, decreased visual acuity, cough, sputum, SOB, hemoptysis, pleuritic pain, palpitaitons, heartburn, abdominal pain, melena, lower extremity edema, claudication, or rash.   Vital Signs:  Patient profile:   50 year old female Height:      64 inches Weight:      175 pounds Pulse rate:   90 / minute Pulse (ortho):   77 / minute Resp:     14 per minute BP supine:   120 / 70  Vitals Entered By: Kem Parkinson (September 05, 2009 9:06 AM)  Physical Exam  General:  Affect appropriate Healthy:  appears stated  age HEENT: normal Neck supple with no adenopathy JVP normal no bruits no thyromegaly Lungs clear with no wheezing and good diaphragmatic motion Heart:  S1/S2 no murmur,rub, gallop or click PMI normal Abdomen: benighn, BS positve, no tenderness, no AAA no bruit.  No HSM or HJR Distal pulses intact with no bruits No edema Neuro non-focal Skin warm and dry    Impression & Recommendations:  Problem # 1:  NONSPECIFIC ABNORMAL UNSPEC CV FUNCTION STUDY (ICD-794.30) Abnormal myovue with inappropriate tachycardia with exercise.  Myovue sugestive of significant LAD disease.   Start Plavix and diagnostic cath with Juanda Chance next Tuesday   TLB-BMP (Basic Metabolic Panel-BMET) (80048-METABOL) TLB-CBC Platelet - w/Differential (85025-CBCD) TLB-PT (Protime) (85610-PTP) TLB-PTT (85730-PTTL) T-2 View CXR (71020TC) Cardiac Catheterization (Cardiac Cath)  Problem # 2:  PAROXYSMAL TACHYCARDIA (ICD-427.2) Depending on results of cath may benefit from beta blocker in regard to CAD and rhythm Her updated medication list for this problem includes:    Aspirin 81 Mg Tbec (Aspirin) .Marland Kitchen... 1 by mouth qd    Plavix 75 Mg Tabs (Clopidogrel bisulfate) .Marland Kitchen... Take two tablets by mouth x two days then take one tablet by mouth once daily  Patient Instructions: 1)  Your physician recommends that  you schedule a follow-up appointment in: 3 weeks. 2)  Your physician recommends that you have lab work today: bmet/cbc/pt/ptt ( 794.30) 3)  Your physician has recommended you make the following change in your medication: 1) Start Plavix 75mg  - take two tablets today and tomorrow, then one tablet by mouth once daily after that. 4)  A chest x-ray takes a picture of the organs and structures inside the chest, including the heart, lungs, and blood vessels. This test can show several things, including, whether the heart is enlarged; whether fluid is building up in the lungs; and whether pacemaker / defibrillator leads are still in  place. 5)  Your physician has requested that you have a cardiac catheterization.  Cardiac catheterization is used to diagnose and/or treat various heart conditions. Doctors may recommend this procedure for a number of different reasons. The most common reason is to evaluate chest pain. Chest pain can be a symptom of coronary artery disease (CAD), and cardiac catheterization can show whether plaque is narrowing or blocking your heart's arteries. This procedure is also used to evaluate the valves, as well as measure the blood flow and oxygen levels in different parts of your heart.  For further information please visit [email or web address]  Please follow instruction sheet, as given. Prescriptions: PLAVIX 75 MG TABS (CLOPIDOGREL BISULFATE) Take two tablets by mouth x two days then take one tablet by mouth once daily  #30 x 6   Entered by:   Sherri Rad, RN, BSN   Authorized by:   Colon Branch, MD, [facility]   Signed by:   Sherri Rad, RN, BSN on 09/05/2009   Method used:   Electronically to        [facility] (retail)       [address]       [place], [place]  [address]       Ph: [phone] or [phone]       Fax: [phone]   RxID:   [id number]

## 2010-06-13 NOTE — Assessment & Plan Note (Signed)
Summary: Cpx / ok this time Dr Macario Golds / #/cd   Vital Signs:  Patient profile:   50 year old female Height:      64 inches Weight:      181 pounds BMI:     31.18 Temp:     98.5 degrees F oral Pulse rate:   92 / minute Pulse rhythm:   regular Resp:     16 per minute BP sitting:   140 / 100  (right arm) Cuff size:   regular  Vitals Entered By: Lanier Prude, Beverly Gust) (June 03, 2010 3:56 PM) CC: CPX Is Patient Diabetic? No   Primary Care Provider:  Jacinta Shoe, MD  CC:  CPX.  History of Present Illness: The patient presents for a preventive health examination   Current Medications (verified): 1)  Claritin 10 Mg  Tabs (Loratadine) .... As Needed 2)  Vitamin D3 1000 Unit  Tabs (Cholecalciferol) .Marland Kitchen.. 1 By Mouth Daily 3)  Allegra-D 24 Hour 180-240 Mg  Tb24 (Fexofenadine-Pseudoephedrine) .Marland Kitchen.. 1 By Mouth Qd 4)  Tamoxifen Citrate 20 Mg  Tabs (Tamoxifen Citrate) .... Once Daily 5)  Zegerid 40-1100 Mg  Caps (Omeprazole-Sodium Bicarbonate) .... Take 1 Tablet By Mouth Once A Day 6)  Advair Diskus 100-50 Mcg/dose Misc (Fluticasone-Salmeterol) .Marland Kitchen.. 1 Puff Bid 7)  Proair Hfa 108 (90 Base) Mcg/act  Aers (Albuterol Sulfate) .... 2 Inh Q4h As Needed Shortness of Breath 8)  Triamcinolone Acetonide 0.5 % Crea (Triamcinolone Acetonide) .... Apply Bid To Affected Area 9)  Pennsaid 1.5 % Soln (Diclofenac Sodium) .... 3-5 Gtt On Skin Three Times A Day For Pain 10)  Vitamin D (Ergocalciferol) 50000 Unit Caps (Ergocalciferol) .Marland Kitchen.. 1 By Mouth Q 1 Week Per Gyn 11)  Diovan 160 Mg Tabs (Valsartan) .Marland Kitchen.. 1 By Mouth Once Daily For Blood Pressure 12)  Sumatriptan Succinate 100 Mg Tabs (Sumatriptan Succinate) .Marland Kitchen.. 1 By Mouth Once Daily As Needed For Migraine  Allergies (verified): 1)  ! Vicodin 2)  ! * Flu Vaccination 3)  Minocin (Minocycline Hcl)  Past History:  Past Medical History: Last updated: 10/26/2009 Current Problems:  CHEST WALL PAIN (ICD-786.52) ABNORMAL ELECTROCARDIOGRAM  (ICD-794.31) FOOT PAIN (ICD-729.5) SHOULDER PAIN (ICD-719.41) HEADACHE (ICD-784.0) MIGRAINE, COMMON W/INTRACTABLE MIGRAINE (ICD-346.11) CYSTITIS (ICD-595.9) LOW BACK PAIN (ICD-724.2) PAROXYSMAL TACHYCARDIA (ICD-427.2) MIGRAINES, HX OF (ICD-V13.8) IRRITABLE BOWEL SYNDROME (ICD-564.1) VOMITING (ICD-787.03) FLATULENCE (ICD-787.3) EXPOSURE TO HAZARDOUS BODY FLUID, HX OF (ICD-V15.85) BREAST CANCER, HX OF (ICD-V10.3) RASH AND OTHER NONSPECIFIC SKIN ERUPTION (ICD-782.1) Hx of PROCTITIS (ICD-569.49) Hx of PEPTIC ULCER DISEASE, HELICOBACTER PYLORI POSITIVE (ICD-533.90) HIATAL HERNIA (ICD-553.3) ALLERGIC RHINITIS (ICD-477.9) SWEATING (ICD-780.8) BREAST CANCER-NOS (ICD-174.9) Dr Dennison Nancy [phone]  Fax [phone] VERTIGO (ICD-780.4) WEIGHT GAIN (ICD-783.1) NECK PAIN (ICD-723.1) GERD (ICD-530.81) ASTHMA (ICD-493.90)  Past Surgical History: Last updated: 09/04/2009 Lumpectomy subcutaneous tumor removal of right thigh Hysteroscopy, D&C.   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   Review of Systems  The patient denies anorexia, fever, weight loss, weight gain, vision loss, decreased hearing, hoarseness, chest pain, syncope, dyspnea on exertion, peripheral edema, prolonged cough, headaches, hemoptysis, abdominal pain, melena, hematochezia, severe indigestion/heartburn, hematuria, incontinence, genital sores, muscle weakness, suspicious skin lesions, transient blindness, difficulty walking, depression, unusual weight change, abnormal bleeding, enlarged lymph nodes, angioedema, and breast masses.  Hot flashes  Physical Exam  General:  Looks well; overweight-appearing. Head:  Normocephalic and atraumatic without obvious  abnormalities. No apparent alopecia or balding. Eyes:  WNL Ears:  External ear exam shows no significant lesions or deformities.  Otoscopic examination reveals clear canals, tympanic membranes are intact bilaterally without bulging, retraction, inflammation or discharge. Hearing is grossly normal bilaterally. Nose:  WNL Mouth:  WNL Neck:  No deformities, masses, or tenderness noted. Lungs:  CTA Heart:  RRR Abdomen:  Bowel sounds positive,abdomen soft and tender in epigastrium without masses, organomegaly or hernias noted. Msk:  WNL R elbow is tender lat Extremities:  No clubbing, cyanosis, edema, or deformity noted with normal full range of motion of all joints.   Neurologic:  No cranial nerve deficits noted. Station and gait are normal. Plantar reflexes are down-going bilaterally. DTRs are symmetrical throughout. Sensory, motor and coordinative functions appear intact. Skin:  WNL Cervical Nodes:  No lymphadenopathy noted Psych:  Oriented X3.     Impression & Recommendations:  Problem # 1:  HEALTH MAINTENANCE EXAM (ICD-V70.0) Assessment Unchanged Labs ordered GYN appt is pending  Declined shots Health and age related issues were discussed. Available screening tests and vaccinations were discussed as well. Healthy life style including good diet and exercise was discussed.   Problem # 2:  BREAST CANCER, HX OF (ICD-V10.3) Assessment: Comment Only  Orders: Oncology Referral (Oncology) Dr Myna Hidalgo  Problem # 3:  SWEATING (ICD-780.8) Assessment: Unchanged  Problem # 4:  ELBOW PAIN (ZOX-096.04) Assessment: Deteriorated She will RTC for imjection later  Complete Medication List: 1)  Claritin 10 Mg Tabs (Loratadine) .... As needed 2)  Vitamin D3 1000 Unit Tabs (Cholecalciferol) .Marland Kitchen.. 1 by mouth daily 3)  Allegra-d 24 Hour 180-240 Mg Tb24 (Fexofenadine-pseudoephedrine) .Marland Kitchen.. 1 by mouth qd 4)  Tamoxifen Citrate 20 Mg Tabs (Tamoxifen citrate) .... Once daily 5)  Zegerid 40-1100 Mg Caps  (Omeprazole-sodium bicarbonate) .... Take 1 tablet by mouth once a day 6)  Advair Diskus 100-50 Mcg/dose Misc (Fluticasone-salmeterol) .Marland Kitchen.. 1 puff bid 7)  Proair Hfa 108 (90 Base) Mcg/act Aers (Albuterol sulfate) .... 2 inh q4h as needed shortness of breath 8)  Triamcinolone Acetonide 0.5 % Crea (Triamcinolone acetonide) .... Apply bid to affected area 9)  Pennsaid 1.5 % Soln (Diclofenac sodium) .... 3-5 gtt on skin three times a day for pain 10)  Vitamin D (ergocalciferol) 50000 Unit Caps (Ergocalciferol) .Marland Kitchen.. 1 by mouth q 1 week per gyn 11)  Diovan 160 Mg Tabs (Valsartan) .Marland Kitchen.. 1 by mouth once daily for blood pressure 12)  Sumatriptan Succinate 100 Mg Tabs (Sumatriptan succinate) .Marland Kitchen.. 1 by mouth once daily as needed for migraine 13)  Epipen 2-pak 0.3 Mg/0.40ml Devi (Epinephrine) .... Use prn 14)  Prednisone 10 Mg Tabs (Prednisone) .... Take 40mg  qd for 1 day, then 20 mg qd for 1 days, then 10mg  qd for 1 days, then stop. take prn bad allergy. 15)  Sudafed 30 Mg Tabs (Pseudoephedrine hcl) .... 2 by mouth qid prn 16)  Benadryl 25 Mg Tabs (Diphenhydramine hcl) .... 2 by mouth qid as needed allergy 17)  Losartan Potassium 100 Mg Tabs (Losartan potassium) .Marland Kitchen.. 1 by mouth once daily for blood pressure 18)  Frova 2.5 Mg Tabs (Frovatriptan succinate) .Marland Kitchen.. 1 by mouth once daily as needed migraine  Patient Instructions: 1)  Please schedule a follow-up appointment in 6 months. 2)  Labs tomorrow V70.0 and fax to Dr Dennison Nancy [phone] 3)  BMP prior to visit, ICD-9: 4)  Hepatic Panel prior  to visit, ICD-9: 5)  Lipid Panel prior to visit, ICD-9: 6)  TSH prior to visit, ICD-9: 7)  CBC w/ Diff prior to visit, ICD-9: 8)  Urine-dip prior to visit, ICD-9: 9)  Appt in Feb for elbow inj. 10)  Can try Bystolic 1 a day for BP Prescriptions: FROVA 2.5 MG TABS (FROVATRIPTAN SUCCINATE) 1 by mouth once daily as needed migraine  #12 x 11   Entered and Authorized by:   Tresa Garter MD   Signed by:   Tresa Garter MD on 06/03/2010   Method used:   Print then Give to Patient   RxID:   [id number] LOSARTAN POTASSIUM 100 MG TABS (LOSARTAN POTASSIUM) 1 by mouth once daily for blood pressure  #30 x 12   Entered and Authorized by:   Tresa Garter MD   Signed by:   Tresa Garter MD on 06/03/2010   Method used:   Print then Give to Patient   RxID:   [id number] BENADRYL 25 MG TABS (DIPHENHYDRAMINE HCL) 2 by mouth qid as needed allergy  #60 x 1   Entered and Authorized by:   Tresa Garter MD   Signed by:   Tresa Garter MD on 06/03/2010   Method used:   Print then Give to Patient   RxID:   [id number] SUDAFED 30 MG TABS (PSEUDOEPHEDRINE HCL) 2 by mouth qid prn  #60 x 1   Entered and Authorized by:   Tresa Garter MD   Signed by:   Tresa Garter MD on 06/03/2010   Method used:   Print then Give to Patient   RxID:   [id number] PREDNISONE 10 MG TABS (PREDNISONE) Take 40mg  qd for 1 day, then 20 mg qd for 1 days, then 10mg  qd for 1 days, then stop. Take prn bad allergy.  #60 x 1   Entered and Authorized by:   Tresa Garter MD   Signed by:   Tresa Garter MD on 06/03/2010   Method used:   Print then Give to Patient   RxID:   [id number] EPIPEN 2-PAK 0.3 MG/0.3ML DEVI (EPINEPHRINE) use prn  #1 x 3   Entered and Authorized by:   Tresa Garter MD   Signed by:   Tresa Garter MD on 06/03/2010   Method used:   Print then Give to Patient   RxID:   [id number]    Orders Added: 1)  Oncology Referral [Oncology] 2)  Est. Patient age 90-64 [phone]

## 2010-06-13 NOTE — Cardiovascular Report (Signed)
Summary: Cath/Percutaneous Orders  Cath/Percutaneous Orders   Imported By: Roderic Ovens 09/17/2009 11:49:06  _____________________________________________________________________  External Attachment:    Type:   Image     Comment:   External Document

## 2010-06-13 NOTE — Letter (Signed)
Summary: Cardiac Catheterization Instructions- Main Lab  [employer], Main Office  [address]   [place], [place] [address]   Phone: [phone]  Fax: [phone]     09/05/2009 MRN: [id number]  [facility] [address] [place], [place]  [address]  Dear Ms. Denbow,   You are scheduled for Cardiac Catheterization on  Tuesday Sep 11, 2009             with Dr. Juanda Chance.  Please arrive at the [facility] at 5:30      a.m. on the day of your procedure.  1. DIET     __x__ Nothing to eat or drink after midnight except your medications with a sip of water.  2. Come to the [place] office on  (today)  for lab work.  The lab at [facility] is open from 8:30 a.m. to 1:30 p.m. and 2:30 p.m. to 5:00 p.m.  The lab at [facility] is open from 7:30 a.m. to 5:30 p.m.  You do not have to be fasting.  3. MAKE SURE YOU TAKE YOUR ASPIRIN.  4. _____ DO NOT TAKE these medications before your procedure:         ________________________________________________________________________________      _x___ YOU MAY TAKE ALL of your remaining medications with a small amount of water.      _x___ START NEW medications:     -Start Plavix 75mg  - take two tablets today (4/27) and tomorrow (4/28) and then take one tablet a day after that.      ____ Pre-med instructions:     ________________________________________________________________________________  5. Plan for one night stay - bring personal belongings (i.e. toothpaste, toothbrush, etc.)  6. Bring a current list of your medications and current insurance cards.  7. Must have a responsible person to drive you home.   8. Someone must be with yu for the first 24 hours after you arrive home.  9. Please wear clothes that are easy to get on and off and wear slip-on shoes.  *Special note: Every effort is made to have your procedure done on time.  Occasionally there are  emergencies that present themselves at the hospital that may cause delays.  Please be patient if a delay does occur.  If you have any questions after you get home, please call the office at the number listed above.  Sherri Rad, RN, BSN

## 2010-06-13 NOTE — Progress Notes (Signed)
Summary: TB & PPD  Phone Note Call from Patient Call back at Work Phone [phone]   Summary of Call: Patient is requesting letter from Dr Posey Rea - She says she is allergic to flu vaccine. Also needs TB test but can not get this so she needs a note for both.  Initial call taken by: Lamar Sprinkles, CMA,  March 27, 2010 3:15 PM  Follow-up for Phone Call        1) Is she allergic to flu vaccine or to eggs? 2) We can do a PPD here... Follow-up by: Tresa Garter MD,  March 27, 2010 5:38 PM  Additional Follow-up for Phone Call Additional follow up Details #1::        Pt says allergy to flu vaccine, and needs cxr, not PPD.  Additional Follow-up by: Lamar Sprinkles, CMA,  March 27, 2010 5:39 PM   New Allergies: ! * FLU VACCINATION Additional Follow-up for Phone Call Additional follow up Details #2::    ok letter re flu what is a problem with ppd?  Follow-up by: Tresa Garter MD,  March 27, 2010 5:42 PM  Additional Follow-up for Phone Call Additional follow up Details #3:: Details for Additional Follow-up Action Taken: left mess to call office back.................Marland KitchenLamar Sprinkles, CMA  March 28, 2010 9:58 AM  Pt called back.  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 Margaret Pyle, CMA  April 01, 2010 10:32 AM   OK Tresa Garter MD  April 01, 2010 1:12 PM   Provided a copy of pt's cxr from April and letter stating she has allergy to flu vaccine & eggs.........Marland KitchenLamar Sprinkles, CMA  April 01, 2010 1:41 PM   New Allergies: ! * FLU VACCINATION

## 2010-06-13 NOTE — Progress Notes (Signed)
Summary: letter for work   Phone Note Call from Patient Call back at [employer] [phone] Call back at Work Phone [phone]   Summary of Call: needs letter stating she can return to work on Monday Initial call taken by: Migdalia Dk,  Sep 11, 2009 3:16 PM  Follow-up for Phone Call        spoke with pt, letter Thayer Headings, RN  Sep 12, 2009 11:54 AM

## 2010-06-13 NOTE — Progress Notes (Signed)
Summary: test results  Phone Note Call from Patient Call back at [facility] Phone [phone]   Summary of Call: calling for test results. Initial call taken by: Lucious Groves,  July 19, 2009 9:44 AM  Follow-up for Phone Call        Patient notified. Follow-up by: Lucious Groves,  July 19, 2009 11:07 AM

## 2010-06-13 NOTE — Assessment & Plan Note (Signed)
Summary: FU D/T--STC   Vital Signs:  Patient profile:   50 year old female Height:      64 inches Weight:      179 pounds BMI:     30.84 O2 Sat:      97 % on Room air Temp:     98.2 degrees F oral Pulse rate:   87 / minute BP sitting:   130 / 94  (left arm) Cuff size:   regular  Vitals Entered By: Lucious Groves (October 26, 2009 1:55 PM)  O2 Flow:  Room air CC: Follow-up visit./kb Is Patient Diabetic? No Pain Assessment Patient in pain? no      Comments Patient notes that she is not taking Levsin./kb   Primary Care Provider:  Jacinta Shoe, MD  CC:  Follow-up visit./kb.  History of Present Illness: The patient presents for a follow up of  back pain, anxiety, depression and headaches C/o B heel pain x wks  Current Medications (verified): 1)  Claritin 10 Mg  Tabs (Loratadine) .... As Needed 2)  Ibuprofen 600 Mg  Tabs (Ibuprofen) .... As Needed 3)  Vitamin D3 1000 Unit  Tabs (Cholecalciferol) .Marland Kitchen.. 1 By Mouth Daily 4)  Allegra-D 24 Hour 180-240 Mg  Tb24 (Fexofenadine-Pseudoephedrine) .Marland Kitchen.. 1 By Mouth Qd 5)  Tamoxifen Citrate 20 Mg  Tabs (Tamoxifen Citrate) .... Once Daily 6)  Zegerid 40-1100 Mg  Caps (Omeprazole-Sodium Bicarbonate) .... Take 1 Tablet By Mouth Once A Day 7)  Advair Diskus 100-50 Mcg/dose Misc (Fluticasone-Salmeterol) .Marland Kitchen.. 1 Puff Bid 8)  Proair Hfa 108 (90 Base) Mcg/act  Aers (Albuterol Sulfate) .... 2 Inh Q4h As Needed Shortness of Breath 9)  Triamcinolone Acetonide 0.5 % Crea (Triamcinolone Acetonide) .... Apply Bid To Affected Area 10)  Levsin 0.125 Mg Tabs (Hyoscyamine Sulfate) .Marland Kitchen.. 1-2 By Mouth Qid Prn 11)  Aspirin 81 Mg Tbec (Aspirin) .Marland Kitchen.. 1 By Mouth Qd  Allergies (verified): 1)  ! Vicodin 2)  Minocin (Minocycline Hcl)  Past History:  Social History: Last updated: 04/26/2008 Occupation: Dental Hygenist Married Never Smoked Alcohol Use -occ Daily Caffeine Use -1 Illicit Drug Use - no Patient does not get regular exercise.   Past Medical  History: Current Problems:  CHEST WALL PAIN (ICD-786.52) ABNORMAL ELECTROCARDIOGRAM (ICD-794.31) FOOT PAIN (ICD-729.5) SHOULDER PAIN (ICD-719.41) HEADACHE (ICD-784.0) MIGRAINE, COMMON W/INTRACTABLE MIGRAINE (ICD-346.11) CYSTITIS (ICD-595.9) LOW BACK PAIN (ICD-724.2) PAROXYSMAL TACHYCARDIA (ICD-427.2) MIGRAINES, HX OF (ICD-V13.8) IRRITABLE BOWEL SYNDROME (ICD-564.1) VOMITING (ICD-787.03) FLATULENCE (ICD-787.3) EXPOSURE TO HAZARDOUS BODY FLUID, HX OF (ICD-V15.85) BREAST CANCER, HX OF (ICD-V10.3) RASH AND OTHER NONSPECIFIC SKIN ERUPTION (ICD-782.1) Hx of PROCTITIS (ICD-569.49) Hx of PEPTIC ULCER DISEASE, HELICOBACTER PYLORI POSITIVE (ICD-533.90) HIATAL HERNIA (ICD-553.3) ALLERGIC RHINITIS (ICD-477.9) SWEATING (ICD-780.8) BREAST CANCER-NOS (ICD-174.9) Dr Dennison Nancy [phone]  Fax [phone] VERTIGO (ICD-780.4) WEIGHT GAIN (ICD-783.1) NECK PAIN (ICD-723.1) GERD (ICD-530.81) ASTHMA (ICD-493.90)  Review of Systems  The patient denies fever, chest pain, dyspnea on exertion, prolonged cough, abdominal pain, melena, hematochezia, severe indigestion/heartburn, hematuria, difficulty walking, depression, abnormal bleeding, and angioedema.    Physical Exam  General:  Looks tired  overweight-appearing. Dry heaving.  Head:  Normocephalic and atraumatic without obvious abnormalities. No apparent alopecia or balding. Eyes:  No icterus Ears:  WNL Nose:  WNL Mouth:  WNL Neck:  No deformities, masses, or tenderness noted. Lungs:  CTA Heart:  RRR Abdomen:  Bowel sounds positive,abdomen soft and tender in epigastrium without masses, organomegaly or hernias noted. Msk:  WNL B heels are tender to palp Neurologic:  No cranial  nerve deficits noted. Station and gait are normal. Plantar reflexes are down-going bilaterally. DTRs are symmetrical throughout. Sensory, motor and coordinative functions appear intact. Skin:  WNL Cervical Nodes:  No lymphadenopathy noted Psych:  Oriented X3.      Impression & Recommendations:  Problem # 1:  FOOT PAIN (ICD-729.5) B - B plantar fasciitis Assessment New Pennsaid Ice  Stretch arches Good shoes  Problem # 2:  TINEA PEDIS (ICD-110.4) Assessment: New  Her updated medication list for this problem includes:    Ketoconazole 2 % Crea (Ketoconazole) ..... Use bid  Problem # 3:  LOW BACK PAIN (ICD-724.2) Assessment: Comment Only  The following medications were removed from the medication list:    Ibuprofen 600 Mg Tabs (Ibuprofen) .Marland Kitchen... As needed    Aspirin 81 Mg Tbec (Aspirin) .Marland Kitchen... 1 by mouth qd  Problem # 4:  IRRITABLE BOWEL SYNDROME (ICD-564.1) Assessment: Improved  Problem # 5:  BREAST CANCER, HX OF (ICD-V10.3) Assessment: Comment Only S/p recent Onc visit Orders: T-Bone Densitometry (40347)  Complete Medication List: 1)  Claritin 10 Mg Tabs (Loratadine) .... As needed 2)  Vitamin D3 1000 Unit Tabs (Cholecalciferol) .Marland Kitchen.. 1 by mouth daily 3)  Allegra-d 24 Hour 180-240 Mg Tb24 (Fexofenadine-pseudoephedrine) .Marland Kitchen.. 1 by mouth qd 4)  Tamoxifen Citrate 20 Mg Tabs (Tamoxifen citrate) .... Once daily 5)  Zegerid 40-1100 Mg Caps (Omeprazole-sodium bicarbonate) .... Take 1 tablet by mouth once a day 6)  Advair Diskus 100-50 Mcg/dose Misc (Fluticasone-salmeterol) .Marland Kitchen.. 1 puff bid 7)  Proair Hfa 108 (90 Base) Mcg/act Aers (Albuterol sulfate) .... 2 inh q4h as needed shortness of breath 8)  Triamcinolone Acetonide 0.5 % Crea (Triamcinolone acetonide) .... Apply bid to affected area 9)  Pennsaid 1.5 % Soln (Diclofenac sodium) .... 3-5 gtt on skin three times a day for pain 10)  Vitamin D (ergocalciferol) 50000 Unit Caps (Ergocalciferol) .Marland Kitchen.. 1 by mouth q 1 week per gyn 11)  Ketoconazole 2 % Crea (Ketoconazole) .... Use bid 12)  Diovan 160 Mg Tabs (Valsartan) .Marland Kitchen.. 1 by mouth once daily for blood pressure  Patient Instructions: 1)  Please schedule a follow-up appointment in 6 months. Prescriptions: DIOVAN 160 MG TABS (VALSARTAN) 1  by mouth once daily for blood pressure  #90 x 3   Entered and Authorized by:   Tresa Garter MD   Signed by:   Tresa Garter MD on 10/26/2009   Method used:   Print then Give to Patient   RxID:   [id number] KETOCONAZOLE 2 % CREA (KETOCONAZOLE) use bid  #90 g x 3   Entered and Authorized by:   Tresa Garter MD   Signed by:   Tresa Garter MD on 10/26/2009   Method used:   Print then Give to Patient   RxID:   [id number] TRIAMCINOLONE ACETONIDE 0.5 % CREA (TRIAMCINOLONE ACETONIDE) apply bid to affected area  #45 g x 0   Entered and Authorized by:   Tresa Garter MD   Signed by:   Tresa Garter MD on 10/26/2009   Method used:   Print then Give to Patient   RxID:   [id number] ADVAIR DISKUS 100-50 MCG/DOSE MISC (FLUTICASONE-SALMETEROL) 1 puff bid  #3 x 3   Entered and Authorized by:   Tresa Garter MD   Signed by:   Tresa Garter MD on 10/26/2009   Method used:   Print then Give to Patient   RxID:   [id number] PROAIR HFA 108 (90 BASE) MCG/ACT  AERS (ALBUTEROL SULFATE) 2 inh q4h as needed shortness of breath  #3 x 3   Entered and Authorized by:   Tresa Garter MD   Signed by:   Tresa Garter MD on 10/26/2009   Method used:   Print then Give to Patient   RxID:   [id number] TAMOXIFEN CITRATE 20 MG  TABS (TAMOXIFEN CITRATE) once daily  #90 x 3   Entered and Authorized by:   Tresa Garter MD   Signed by:   Tresa Garter MD on 10/26/2009   Method used:   Print then Give to Patient   RxID:   [id number] ALLEGRA-D 24 HOUR 180-240 MG  TB24 (FEXOFENADINE-PSEUDOEPHEDRINE) 1 by mouth qd  #90 x 73   Entered and Authorized by:   Tresa Garter MD   Signed by:   Tresa Garter MD on 10/26/2009   Method used:   Print then Give to Patient   RxID:   [id number] CLARITIN 10 MG  TABS (LORATADINE) as needed  #90 x 3   Entered and Authorized by:   Tresa Garter MD   Signed by:    Tresa Garter MD on 10/26/2009   Method used:   Print then Give to Patient   RxID:   [id number] VITAMIN D (ERGOCALCIFEROL) 50000 UNIT CAPS (ERGOCALCIFEROL) 1 by mouth q 1 week per GYN  #12 x 3   Entered and Authorized by:   Tresa Garter MD   Signed by:   Tresa Garter MD on 10/26/2009   Method used:   Print then Give to Patient   RxID:   [id number] ZEGERID 40-1100 MG  CAPS (OMEPRAZOLE-SODIUM BICARBONATE) Take 1 tablet by mouth once a day  #90 x 3   Entered and Authorized by:   Tresa Garter MD   Signed by:   Tresa Garter MD on 10/26/2009   Method used:   Print then Give to Patient   RxID:   [id number] PENNSAID 1.5 % SOLN (DICLOFENAC SODIUM) 3-5 gtt on skin three times a day for pain  #1 x 3   Entered and Authorized by:   Tresa Garter MD   Signed by:   Tresa Garter MD on 10/26/2009   Method used:   Print then Give to Patient   RxID:   [id number]

## 2010-06-13 NOTE — Miscellaneous (Signed)
Summary: Orders Update  Clinical Lists Changes  Problems: Added new problem of CALF PAIN, RIGHT (ICD-729.5) Orders: Added new Test order of Venous Duplex Lower Extremity (Venous Duplex Lower) - Signed

## 2010-06-13 NOTE — Progress Notes (Signed)
  Phone Note Refill Request Message from:  Fax from Pharmacy on September 05, 2009 10:50 AM  Refills Requested: Medication #1:  MAXALT-MLT 5 MG TBDP 1 by mouth once daily prn Pt requesting refill, Please Advise  Initial call taken by: Ami Bullins CMA,  September 05, 2009 10:51 AM  Follow-up for Phone Call        ok 4 ref in 2 wks - do not before you see a cardiologist - Med could potentially cause a coronary spasm Follow-up by: Tresa Garter MD,  September 05, 2009 1:14 PM  Additional Follow-up for Phone Call Additional follow up Details #1::        tried to call pt with no answer Additional Follow-up by: Ami Bullins CMA,  September 05, 2009 4:47 PM    Additional Follow-up for Phone Call Additional follow up Details #2::    Pt informed  Follow-up by: Lamar Sprinkles, CMA,  September 06, 2009 1:48 PM

## 2010-06-13 NOTE — Progress Notes (Signed)
Summary: Nuclear Pre-Procedure  Phone Note Outgoing Call Call back at [facility] Phone [phone]   Call placed by: Stanton Kidney, EMT-P,  August 28, 2009 2:57 PM Action Taken: Phone Call Completed Summary of Call: Left message with information on Myoview Information Sheet (see scanned document for details).     Nuclear Med Background Indications for Stress Test: Evaluation for Ischemia   History: Asthma   Symptoms: Chest Pain, Rapid HR    Nuclear Pre-Procedure Height (in): 64

## 2010-06-13 NOTE — Progress Notes (Signed)
  Phone Note Call from Patient   Summary of Call: Imitrex ref Initial call taken by: Tresa Garter MD,  February 09, 2010 10:06 PM  Follow-up for Phone Call        ok Follow-up by: Tresa Garter MD,  February 09, 2010 10:06 PM    New/Updated Medications: SUMATRIPTAN SUCCINATE 100 MG TABS (SUMATRIPTAN SUCCINATE) 1 by mouth once daily as needed for migraine Prescriptions: SUMATRIPTAN SUCCINATE 100 MG TABS (SUMATRIPTAN SUCCINATE) 1 by mouth once daily as needed for migraine  #6 x 12   Entered and Authorized by:   Tresa Garter MD   Signed by:   Tresa Garter MD on 02/09/2010   Method used:   Electronically to        [facility] (retail)       [address]       [place], [place]  [address]       Ph: [phone] or [phone]       Fax: [phone]   RxID:   [id number]

## 2010-06-13 NOTE — Progress Notes (Signed)
Summary: letter   Phone Note Call from Patient Call back at Work Phone [phone]   Caller: Patient Reason for Call: Talk to Nurse Summary of Call: needs letter stating time she was here on Friday the 13th.... please fax to [phone] Initial call taken by: Migdalia Dk,  Oct 04, 2009 11:59 AM  Follow-up for Phone Call        letter generated and faxed Deliah Goody, RN  Oct 04, 2009 5:26 PM

## 2010-06-13 NOTE — Assessment & Plan Note (Signed)
Summary: Cardiology Nuclear Study  Nuclear Med Background Indications for Stress Test: Evaluation for Ischemia   History: Asthma   Symptoms: Chest Pain, Rapid HR    Nuclear Pre-Procedure Caffeine/Decaff Intake: None NPO After: 7:30 PM Lungs: clear IV 0.9% NS with Angio Cath: 24g     IV Site: (R) Hand IV Started by: Irean Hong RN Chest Size (in) 36     Cup Size C     Height (in): 64 Weight (lb): 174 BMI: 29.97  Nuclear Med Study 1 or 2 day study:  1 day     Stress Test Type:  Eugenie Birks Reading MD:  Olga Millers, MD     Referring MD:  A.Plotnikov Resting Radionuclide:  Technetium 12m Tetrofosmin     Resting Radionuclide Dose:  11.0 mCi  Stress Radionuclide:  Technetium 37m Tetrofosmin     Stress Radionuclide Dose:  33.0 mCi   Stress Protocol   Lexiscan: 0.4 mg   Stress Test Technologist:  Milana Na EMT-P     Nuclear Technologist:  Domenic Polite CNMT  Rest Procedure  Myocardial perfusion imaging was performed at rest 45 minutes following the intravenous administration of Myoview Technetium 64m Tetrofosmin.  Stress Procedure  The patient received IV Lexiscan 0.4 mg over 15-seconds.  Myoview injected at 30-seconds.  There were no significant changes with infusion.  Quantitative spect images were obtained after a 45 minute delay.  QPS Raw Data Images:  Acuisition technically good; normal left ventricular size. Stress Images:  There is decreased uptake in the anterior wall and apex. Rest Images:  There is decreased uptake in the anterior wall and apex, less prominent compared to the stress images. Subtraction (SDS):  These findings are consistent with prior anterior infarct and mild to moderate peri-infarct  ischemia. Transient Ischemic Dilatation:  1.04  (Normal <1.22)  Lung/Heart Ratio:  .27  (Normal <0.45)  Quantitative Gated Spect Images QGS EDV:  82 ml QGS ESV:  26 ml QGS EF:  68 % QGS cine images:  Normal wall motion.   Overall  Impression  Exercise Capacity: Lexiscan  study with no exercise. BP Response: Normal blood pressure response. Clinical Symptoms: There is  chest pain ECG Impression: No significant ST segment change suggestive of ischemia. Overall Impression: Abnormal lexiscan nuclear study with prior anterior and apical infarct with mild to moderate peri-infarct ischemia.

## 2010-06-13 NOTE — Letter (Signed)
Summary: [facility] - Outpatient Coins[facility] Notice  [facility] - Outpatient Co[facility] Notice   Imported By: Marylou Mccoy 08/30/2009 11:44:03  _____________________________________________________________________  External Attachment:    Type:   Image     Comment:   External Document

## 2010-06-14 ENCOUNTER — Ambulatory Visit: Payer: Self-pay | Admitting: Internal Medicine

## 2010-06-19 NOTE — Letter (Signed)
Summary: Generic Letter  [facility]  [address] [place], [place] [address]   Phone: [phone]  Fax: [phone]    06/13/2010  Sarah Waller [address] [place], [place]  [address]  To Whom it may concern   Sarah Waller has a history of breast cancer and is followed by an oncologist. Please contact our office with any further questions.       Sincerely,     A. Plotnikov MD

## 2010-06-19 NOTE — Progress Notes (Signed)
Summary: Req for letter  Phone Note Call from Patient   Summary of Call: Pt left a note today - She needs a letter from MD about her condition - Hx of breast cancer.  Initial call taken by: Lamar Sprinkles, CMA,  June 10, 2010 5:35 PM  Follow-up for Phone Call        any details? Thank you!  Follow-up by: Tresa Garter MD,  June 10, 2010 6:28 PM  Additional Follow-up for Phone Call Additional follow up Details #1::        left mess to call office back...................Marland KitchenLamar Sprinkles, CMA  June 11, 2010 12:25 PM   Spoke w/pt - she needs a letter stating that she has a history of breast cancer and is under the care of oncologist who monitors this. Should have very little details - pt req we mail to her home address. OK?  Additional Follow-up by: Lamar Sprinkles, CMA,  June 12, 2010 6:12 PM    Additional Follow-up for Phone Call Additional follow up Details #2::    ok Thank you!  Follow-up by: Tresa Garter MD,  June 13, 2010 8:01 AM

## 2010-08-05 ENCOUNTER — Ambulatory Visit: Payer: Self-pay | Admitting: Hematology & Oncology

## 2010-08-09 ENCOUNTER — Ambulatory Visit: Payer: Self-pay | Admitting: Hematology & Oncology

## 2010-08-12 ENCOUNTER — Encounter (HOSPITAL_BASED_OUTPATIENT_CLINIC_OR_DEPARTMENT_OTHER): Payer: PRIVATE HEALTH INSURANCE | Admitting: Hematology & Oncology

## 2010-08-12 ENCOUNTER — Other Ambulatory Visit: Payer: Self-pay | Admitting: Hematology & Oncology

## 2010-08-12 DIAGNOSIS — C50519 Malignant neoplasm of lower-outer quadrant of unspecified female breast: Secondary | ICD-10-CM

## 2010-08-12 DIAGNOSIS — Z170 Estrogen receptor positive status [ER+]: Secondary | ICD-10-CM

## 2010-08-12 LAB — CBC WITH DIFFERENTIAL (CANCER CENTER ONLY)
BASO%: 0.2 % (ref 0.0–2.0)
LYMPH%: 29.2 % (ref 14.0–48.0)
MCV: 88 fL (ref 81–101)
MONO#: 0.3 10*3/uL (ref 0.1–0.9)
NEUT#: 2.6 10*3/uL (ref 1.5–6.5)
Platelets: 181 10*3/uL (ref 145–400)
RDW: 12.8 % (ref 11.1–15.7)
WBC: 4.2 10*3/uL (ref 3.9–10.0)

## 2010-08-13 ENCOUNTER — Other Ambulatory Visit: Payer: Self-pay | Admitting: Hematology & Oncology

## 2010-08-13 LAB — COMPREHENSIVE METABOLIC PANEL
Albumin: 4.2 g/dL (ref 3.5–5.2)
CO2: 26 mEq/L (ref 19–32)
Calcium: 8.9 mg/dL (ref 8.4–10.5)
Chloride: 107 mEq/L (ref 96–112)
Glucose, Bld: 113 mg/dL — ABNORMAL HIGH (ref 70–99)
Potassium: 3.5 mEq/L (ref 3.5–5.3)
Sodium: 144 mEq/L (ref 135–145)
Total Protein: 6.4 g/dL (ref 6.0–8.3)

## 2010-08-13 LAB — BASIC METABOLIC PANEL
BUN: 9 mg/dL (ref 6–23)
Creatinine, Ser: 0.54 mg/dL (ref 0.4–1.2)
GFR calc non Af Amer: >60 mL/min (ref >60)

## 2010-08-13 LAB — URINALYSIS, ROUTINE W REFLEX MICROSCOPIC
Bilirubin Urine: NEGATIVE
Hgb urine dipstick: NEGATIVE
Specific Gravity, Urine: 1.025 (ref 1.005–1.030)
Urobilinogen, UA: 0.2 mg/dL (ref 0.0–1.0)

## 2010-08-13 LAB — IRON AND TIBC
%SAT: 34 % (ref 20–55)
Iron: 111 ug/dL (ref 42–145)
TIBC: 331 ug/dL (ref 250–470)
UIBC: 220 ug/dL

## 2010-08-13 LAB — PREGNANCY, URINE: Preg Test, Ur: NEGATIVE

## 2010-08-13 LAB — CBC
Platelets: 174 10*3/uL (ref 150–400)
WBC: 4.5 10*3/uL (ref 4.0–10.5)

## 2010-08-13 LAB — TSH: TSH: 2.323 u[IU]/mL (ref 0.350–4.500)

## 2010-08-13 LAB — VITAMIN D 25 HYDROXY (VIT D DEFICIENCY, FRACTURES): Vit D, 25-Hydroxy: 40 ng/mL (ref 30–89)

## 2010-08-13 LAB — LACTATE DEHYDROGENASE: LDH: 159 U/L (ref 94–250)

## 2010-09-13 ENCOUNTER — Ambulatory Visit (INDEPENDENT_AMBULATORY_CARE_PROVIDER_SITE_OTHER): Payer: PRIVATE HEALTH INSURANCE | Admitting: Internal Medicine

## 2010-09-13 DIAGNOSIS — R079 Chest pain, unspecified: Secondary | ICD-10-CM

## 2010-09-13 MED ORDER — OMEPRAZOLE-SODIUM BICARBONATE 40-1100 MG PO CAPS: 1.0000 | ORAL_CAPSULE | Freq: Every day | ORAL | Status: DC

## 2010-09-15 NOTE — Progress Notes (Signed)
  Subjective:    Patient ID: Sarah Waller, female    DOB: 13-Jul-1960, 51 y.o.   MRN: [id number]  HPI Sarah Waller, a patient of Dr. Loren Racer, called this morning with a complaint of chest pain. 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 She reports exertional chest pain, described as a heavy pressure along with some sharp pain. She denies diaphoresis, SOB or DOE or radiation of pain.  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        Review of Systems Review of Systems  Constitutional:  Negative for fever, chills, activity change and unexpected weight change.  HENT:  Negative for hearing loss, ear pain, congestion, neck stiffness and postnasal drip.   Eyes: Negative for pain, discharge and visual disturbance.  Respiratory: Negative for chest tightness and wheezing.   Cardiovascular: chest pain - central chest, heavy in nature, worse with exertion. Gastrointestinal: [No change in bowel habit. No bloating or gas. No reflux or indigestion Genitourinary: Negative for urgency, frequency, flank pain and difficulty urinating.  Musculoskeletal: Negative for myalgias, back pain, arthralgias and gait problem.  Neurological: Negative for dizziness, tremors, weakness and headaches.  Hematological: Negative for adenopathy.  Psychiatric/Behavioral: Negative for behavioral problems and dysphoric mood.       Objective:   Physical Exam WNWD white woman in no acute distress. HEENT- nl Neck -suplle Chest - clear COR - RRR, no murmur, rub or gallop Abdomen- BS + x 4, no guarding or rebound, mild tenderness to deep palpation epigastrum  12 lead EKG without acute changes or old injury       Assessment & Plan:  1. Chest pain - atypical symptoms, normal EKG. Chart review reveals a cardiac cath May '11 that was negative for any obstructive coronary disease.  Plan - PPI therapy.

## 2010-09-16 ENCOUNTER — Telehealth: Payer: Self-pay | Admitting: *Deleted

## 2010-09-16 NOTE — Telephone Encounter (Signed)
Pt was prescribed zegerid by Dr Debby Bud on Friday. This req PA per pt and she is going out of town x 14 days. No zegerid avail for samples, ok dexilant samples while waiting on PA. Please proceed with PA once received and let me know if I can be of any help. (Pt is out of country x 14 days and was given #15 dexilant)

## 2010-09-18 ENCOUNTER — Telehealth: Payer: Self-pay | Admitting: *Deleted

## 2010-09-18 NOTE — Telephone Encounter (Signed)
rec fax stating Zegerid is not preferred. I contacted pt's ins and they are faxing a letter stating the preferred alternatives and specific details.

## 2010-09-19 NOTE — Telephone Encounter (Signed)
Per manadatory step therapy guidelines Preferred alts are: pantoprazole, Omeprazole OR Nexium. Please advise which alt is acceptable.

## 2010-09-19 NOTE — Telephone Encounter (Signed)
OK Nexium 40 mg po qd #30  11 ref Thx

## 2010-09-20 MED ORDER — ESOMEPRAZOLE MAGNESIUM 40 MG PO CPDR: 40.0000 mg | DELAYED_RELEASE_CAPSULE | Freq: Every day | ORAL | Status: DC

## 2010-09-24 NOTE — Assessment & Plan Note (Signed)
[facility]                         GASTROENTEROLOGY OFFICE NOTE   Waller, Sarah                     MRN:          [id number]  DATE:11/04/2006                            DOB:          11-Nov-1960    [name] is a 50 year old [place] female here in the [place]  for the past 11 years who has chronic epigastric subxiphoid symptoms of  discomfort.  It first just started on her arrival to the [place]  11 years ago and then eventually went away.  She at that time had upper  endoscopy by Dr. Jarold Motto which showed small hiatal hernia and positive  H. pylori test.  She was treated for it.  Her symptoms went away  subsequently and reoccurred again several months ago.  The patient was  taking a lot of ibuprofen for tennis elbow and after starting  Zegerid  40 mg daily her symptoms improved, they were much worse about 2 months  ago but now they are better.  She denies any dysphagia or odynophagia.  She does not smoke, she does not drink alcohol.  She has intentionally  lost about 20 pounds.   MEDICATIONS:  1. Zegerid 40 mg p.o. daily   PHYSICAL EXAMINATION:  Blood pressure 120/76, pulse 72, weight 165  pounds.  She is alert, oriented and in no distress.  LUNGS:  Clear to auscultation.  COR:  With normal S1, S2.  ABDOMEN:  Soft, mildly tender in epigastrium and subxiphoid area of  costal chondral junction.  Her rib cage was normal. Lower abdomen was  unremarkable.   IMPRESSION:  A 50 year old white female with persistent epigastric  discomfort and history of H. pylori.  She is about 80% improved on  Zegerid after discontinuation of ibuprofen Her symptoms may becaused by  stress as well the use of NSAIDs.  We need to consider possibility of a  hiatal hernia as well as peptic ulcer disease.   PLAN:  1. Continue Zegerid 40 mg p.o. daily, increase it to b.i.d. on a      p.r.n. basis.  2. Add Mylanta 2 p.r.n.  3. Upper endoscopy  scheduled.  We will plan to do H. pylori test as      well.     Hedwig Morton. Juanda Chance, MD  Electronically Signed    DMB/MedQ  DD: 11/04/2006  DT: 11/04/2006  Job #: [id number]   cc:   Georgina Quint. Plotnikov, MD

## 2010-09-24 NOTE — Op Note (Signed)
Sarah Waller, Sarah Waller            ACCOUNT NO.:  [id number]   MEDICAL RECORD NO.:  [id number]          PATIENT TYPE:  AMB   LOCATION:  SDS                          FACILITY:  [facility]   PHYSICIAN:  Currie Paris, M.D.DATE OF BIRTH:  08/11/1960   DATE OF PROCEDURE:  03/01/2007  DATE OF DISCHARGE:  03/01/2007                               OPERATIVE REPORT   OFFICE MEDICAL RECORD NUMBER:  [id number].   PREOPERATIVE DIAGNOSIS:  Left breast cancer, lower outer quadrant.   POSTOPERATIVE DIAGNOSIS:  Left breast cancer, lower outer quadrant.   OPERATION:  Left partial mastectomy, with blue dye injection and  axillary sentinel lymph node biopsy (three nodes).   SURGEON:  Currie Paris, M.D.   ANESTHESIA:  General.   CLINICAL HISTORY:  This patient is a 50 year old lady with a palpable  biopsy-proven left breast cancer, lower outer quadrant, that was close  to 2 cm in size.  After appropriate evaluation and a lengthy discussion  with the patient and her husband, she elected proceed to lumpectomy and  sentinel lymph node evaluation.   DESCRIPTION OF PROCEDURE:  The patient seen in the holding area, and she  had no further questions.  We both confirmed the left side was the  operative side.  She had already been injected with a radioactive  isotope.   The patient was taken to the operating room, and after satisfactory  general anesthesia had been obtained, the time-out was done.  I then  prepped the area of the breast with some alcohol and injected 5 mL of  dilute methylene blue subareolarly, and this was massaged in.   The breast was then prepped and draped as a sterile field.  Using the  Neoprobe, I identified a hot area in the axilla and made a transverse  incision.  I divided some of the subcutaneous tissues and placed a self-  retaining retractor.  I identified a blue lymphatic, and with a little  bit of dissection found a small blue lymph node which was removed and  had  counts of about 40.  Further dissection following the blue lymphatic  led to a second blue lymph node that was removed and had counts of about  60.  There was still a hot area, and I did a little bit further  dissection and found a larger third lymph node that had counts of about  300.  With removal of this lymph node, there were no further counts,  with background being down to 0.  There was no palpable adenopathy.  There was no other blue dye or blue lymphatics noted.  Moist pack was  placed and attention turned back to the breast.   The mass had already been identified and marked.  I decided make an  elliptical incision and take a small ellipse of skin directly over it.  I then raised the skin flaps, dividing subcutaneous tissue, going first  inferiorly, then medially, then superiorly and down to the chest wall  and then coming under all of this.  The mass was actually fairly mobile  and, as I was coming under it,  it seemed to slip out of the fatty breast  tissue, but I got what I thought was completely around it, although it  was basically located at the lateral edge of my lumpectomy specimen near  the deep margin.  Once this was out, I put some orienting sutures on  this.  I then went back and took additional margins so that I had a deep  margin all the way to the chest wall, an additional centimeter of margin  around the superior, lateral, and inferior edges of the prior lumpectomy  cavity, so that I was well around the area where the tumor was visibly  close to the original margin.  All of this looked like normal tissue.   I spent several minutes irrigating, making sure everything was dry.  I  injected about 20 mL of 0.25% plain Marcaine into the muscles,  subcutaneous tissues, and breast to help with postop analgesia.  I then  reapproximated some of the deeper breast tissue to cover the muscle and  then closed the incision with some 3-0 Vicryl in the subcutaneous, and 4-  0  Monocryl subcuticularly, leaving a cavity which I thought might be a  candidate for MammoSite should she elected type of radiation therapy and  should her nodes prove negative.   Attention was turned back to the axillary incision, and this was  injected with about 10 mL of 0.25% plain Marcaine and closed with Vicryl  and Monocryl.   Pathology reported that the three sentinel lymph nodes were negative.  I  discussed the orientation and the margins with the pathologist for the  primary lumpectomy.   The patient tolerated the procedure well, and there were no  complications.  All counts were correct.      Currie Paris, M.D.  Electronically Signed     CJS/MEDQ  D:  03/01/2007  T:  03/02/2007  Job:  [id number]   cc:   Leatha Gilding. Mezer, M.D.

## 2010-09-27 NOTE — Assessment & Plan Note (Signed)
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   I had seen this patient many years ago for irritable bowel syndrome and acid  reflux.  She apparently had been doing well without serious medical  problems, being followed closely by Dr. Posey Rea.  She recently has had  burning, some sternal pain radiating to her chest which is partially  relieved by taking Zegerid.  She denies dysphagia or any hepatobiliary  complaints.  Last endoscopic exam was in February 1997 and at that time she  did have a positive H. pylori antibody and she responded to acid reflux  therapy and I do not think she was treated for H. pylori.  Her additional  problem at this time is one of recurrent tenesmus with rectal pain and spasm  approximately once a week.  This pain will come out of the blue, will last  for a couple of hours and be associated with nausea and dry heaves.  In  between these episodes she is having regular bowel movements without melena,  hematochezia or lower abdominal pain.  She follows a regular diet, has had  no anorexia, weight loss, or specific food intolerances.   On reviewing her chart, she has apparently had some right flank pain in  December 2006 and had a CT scan of the abdomen, which showed no evidence of  renal calculi, but there was some hazy density in the small bowel mesentery  of questionable significance and some prominent air and stool filled loops  of colon.  I cannot see that this patient has had colonoscopy exam in the  past.  Last laboratory data showed a normal CBC, electrolytes a year ago.  She was seen previously in the past, had a negative  ANA, hepatitis B,  surface antibody, hepatitis C antibody, and sed rate.  I cannot see  hepatitis B surface antigen in her chart.   PAST MEDICAL HISTORY:  The patient's past medical history is otherwise  noncontributory.  She denies any chronic medical problems.   MEDICATIONS CURRENTLY:  1. Erythromycin.  2. Benicar 40/12.5 mg daily.  3. P.r.n. Allegra D.  4. P.r.n. Darvocet-N 100.  5. P.r.n. Phenergan.  6. P.r.n. ibuprofen.  7. Zegerid 40 mg daily.  8. She also has some codeine to use for severe rectal pain.   FAMILY HISTORY:  Noncontributory   SOCIAL HISTORY:  Patient is married and lives with her husband.  She has a  bachelors's degree and works as a Armed forces operational officer.  She does not smoke or  use ethanol.   REVIEW OF SYSTEMS:  Entirely noncontributory without any symptoms of  collagen vascular disease or Raynaud's phenomenon.  She denies fever,  chills, anorexia, weight loss or any history of hepatobiliary problems or  history of pancreatitis or hepatitis.  She has had no previous abdominal or  pelvic surgeries.   EXAMINATION:  Shows her to be a healthy-appearing white female appearing her  stated age.  She weighs 170 pounds and blood pressure 120/80.  Pulse was 60 and regular.  I could not appreciate stigmata of chronic liver disease or thyromegaly.  CHEST:  Was clear to percussion and auscultation, and I could not appreciate  murmurs, gallops or rubs.  She appeared to be in a regular rhythm.  There was no hepatosplenomegaly, abdominal masses or tenderness.  Bowel  sounds were normal.  Peripheral extremities were unremarkable.  Mental status was clear.  Rectum did show a skin tag anteriorly but no fissures or fistulae.  There  were no masses or tenderness with formed stools in the rectal vault, was  Guaiac negative.   ASSESSMENT:  1. Gastroesophageal reflux disease, proven on proton pump inhibitor      therapy.  2. Rectal tenesmus, probably associated with mild  proctitis.  3. Need for screening colonoscopy for symptoms and age.  4. 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  5. History of previous H. pylori positivity.   RECOMMENDATIONS:  1. Repeat screening laboratory parameters to include amylase and lipase.  2. Reflux regimen with twice a day Zegerid 30 minutes before breakfast and      supper.  3. Canasa 1 gm suppositories at bedtime with p.r.n. subinguinal Levsin      use.  4. Outpatient colonoscopy and possible endoscopy.  5. Continue other medications as per Dr. Posey Rea.     Vania Rea. Jarold Motto, MD, Caleen Essex, FAGA  Electronically Signed    DRP/MedQ  DD: 03/20/2006  DT: 03/20/2006  Job #: [id number]   cc:   Georgina Quint. Plotnikov, MD

## 2010-09-27 NOTE — Assessment & Plan Note (Signed)
 HEALTHCARE                             PRIMARY CARE OFFICE NOTE   KALIA, VAHEY                     MRN:          [id number]  DATE:03/03/2006                            DOB:          1960/08/09    The patient is a 50 year old female who was working today with complaint of  acute onset episode of severe abdominal pain with cramping and urges to  defecate that lasted for 1-1 1/2 hour and eventually resolved. It happened  at work. She felt bloating, a little peristalsis, eventually put two fingers  in her throat and threw up. Throwing up brought her some relief. She was  able to have a small bowel movement and was burping. Also, describes severe  spasms in the rectal area. It is the third episode in the last 1 1/2 months.   HISTORY:  1. History of something like pelvic inflammatory disease at age of 8.  2. No abdominal surgeries.  3. 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 The      symptoms were different. She had a CT scan.   SOCIAL HISTORY:  Does smoke or drink. She is a Armed forces operational officer.   REVIEW OF SYSTEMS:  No new food. She had diarrhea about a month and a half  ago that resolved. No weight loss. No blood in the stool. No mucus. No  diarrhea.   FAMILY HISTORY:  Negative for colon cancer or inflammatory colitis.   CURRENT MEDICATIONS:  1. Zegerid samples.  2. Claritin p.r.n.  3. Ibuprofen p.r.n.   PHYSICAL EXAMINATION:  She is in no acute distress. Blood pressure: 141/89.  Pulse: 69. Temperature: 99.0.  HEENT: With moist mucosa. Neck is supple.  LUNGS:  Clear.  HEART: Regular and no tachycardia.  ABDOMEN: Soft, nondistended. Sensitive on the right lower quadrant. No  masses felt.  RECTAL: Not done.  No organomegaly. No hernia.  LABS: CT scan April 21, 2005 with panniculitis of the mesentery.   ASSESSMENT/PLAN:  Three episodes of severe abdominal pain with bloating,  rectal pain, tenesmus,  small bowel movement, relieved by self-inflicted  vomiting, of unclear etiology. Differential diagnoses includes partial small  bowel obstruction, severe rectalgia, inflammatory bowel disease and others.  Obtain GI consultation with Dr. Juanda Chance. She was instructed to go to ER if  the problem recurs so we can get acute abdominal series/CT scan/labs. I gave  her  oxycodone 5 mg #60 to use 1-2 q.i.d. p.r.n., Phenergan 25 p.o. q.i.d. p.r.n.  She will use hemoccult cards when her period is over.            ______________________________  Georgina Quint Plotnikov, MD      AVP/MedQ  DD:  03/03/2006  DT:  03/04/2006  Job #:  [id number]   cc:   Hedwig Morton. Juanda Chance, MD

## 2010-10-02 NOTE — Telephone Encounter (Signed)
Called [facility] to inquire as to why we have not revcd the PA paperwork as requested on 09/16/10; informed that if Brand name not necessary, then no PA needed.? [only $10 co-pay]  Pt informed.

## 2010-11-05 ENCOUNTER — Other Ambulatory Visit: Payer: Self-pay | Admitting: Internal Medicine

## 2010-11-22 IMAGING — CR DG CHEST 2V
2 series · 2 of 2 positions shown · non-contrast
Comparison: CT chest of 02/11/2008 and chest x-ray of 02/25/2007

CLINICAL DATA: Chest wall pain

CHEST - 2 VIEW

[view not recorded (1 of 2)]
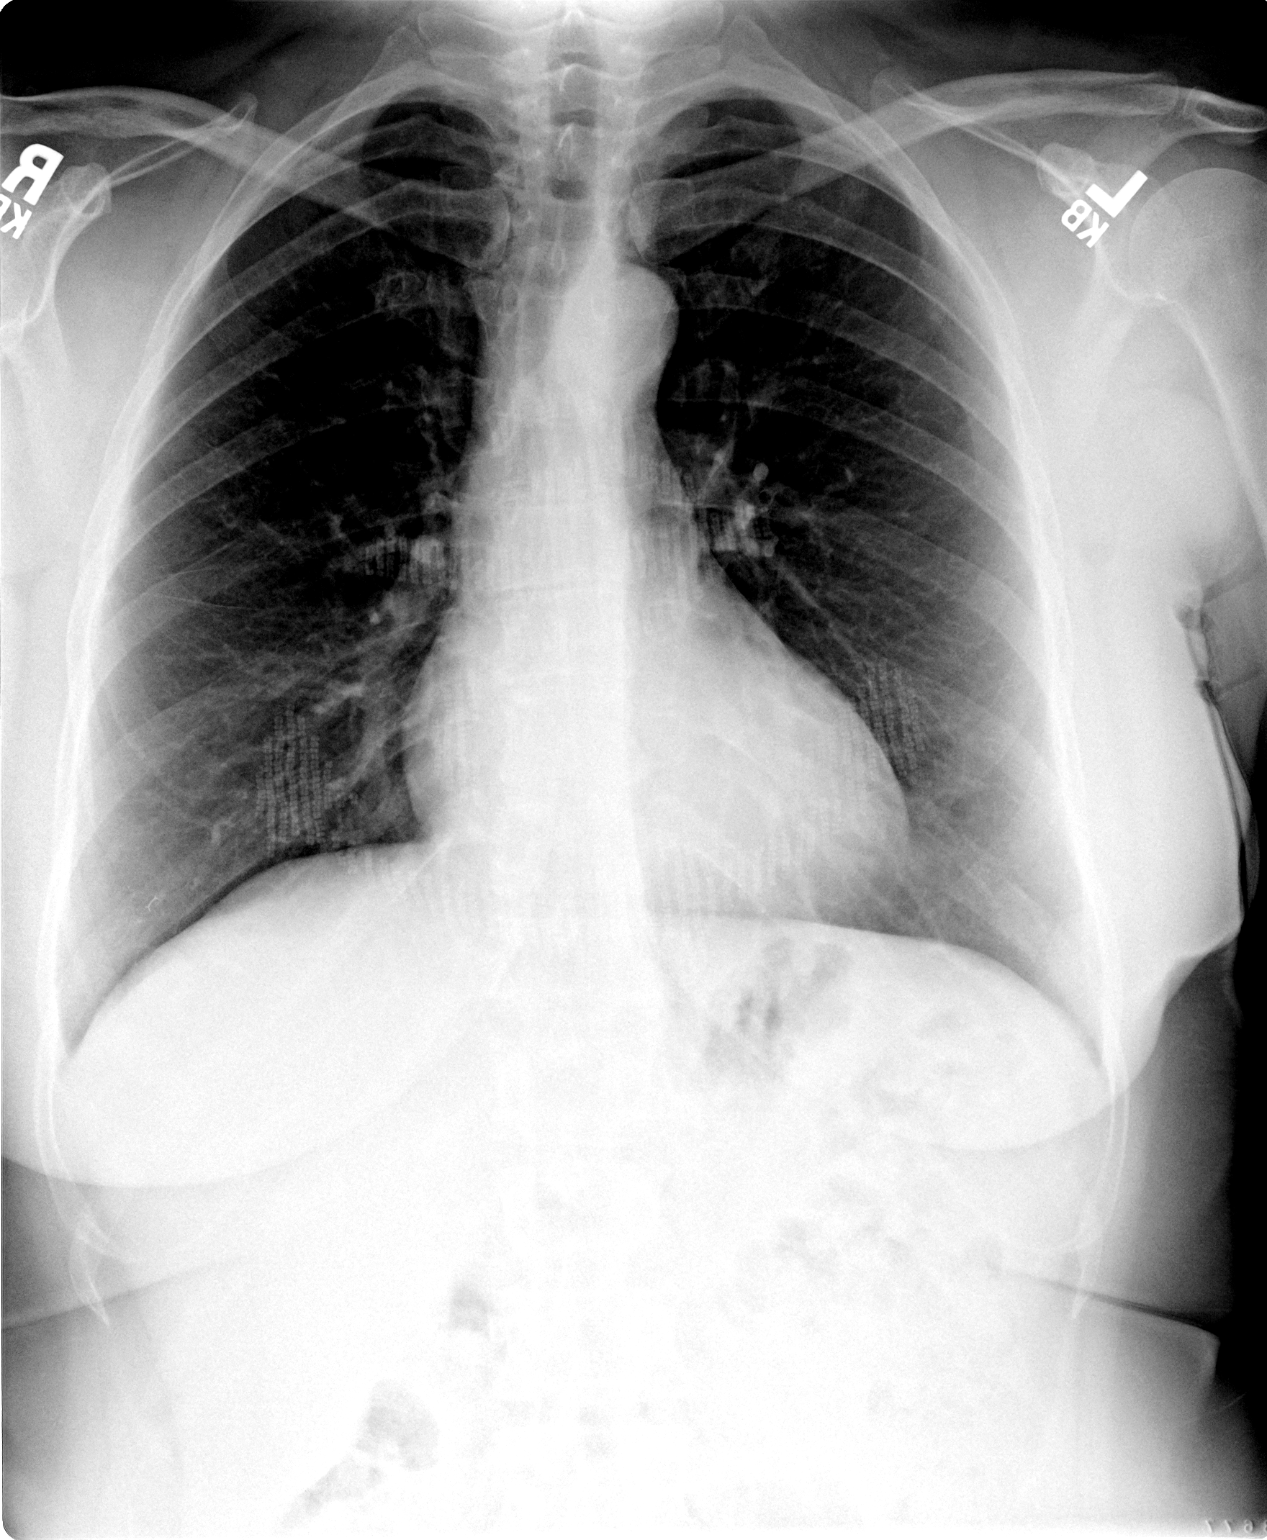

[view not recorded (2 of 2)]
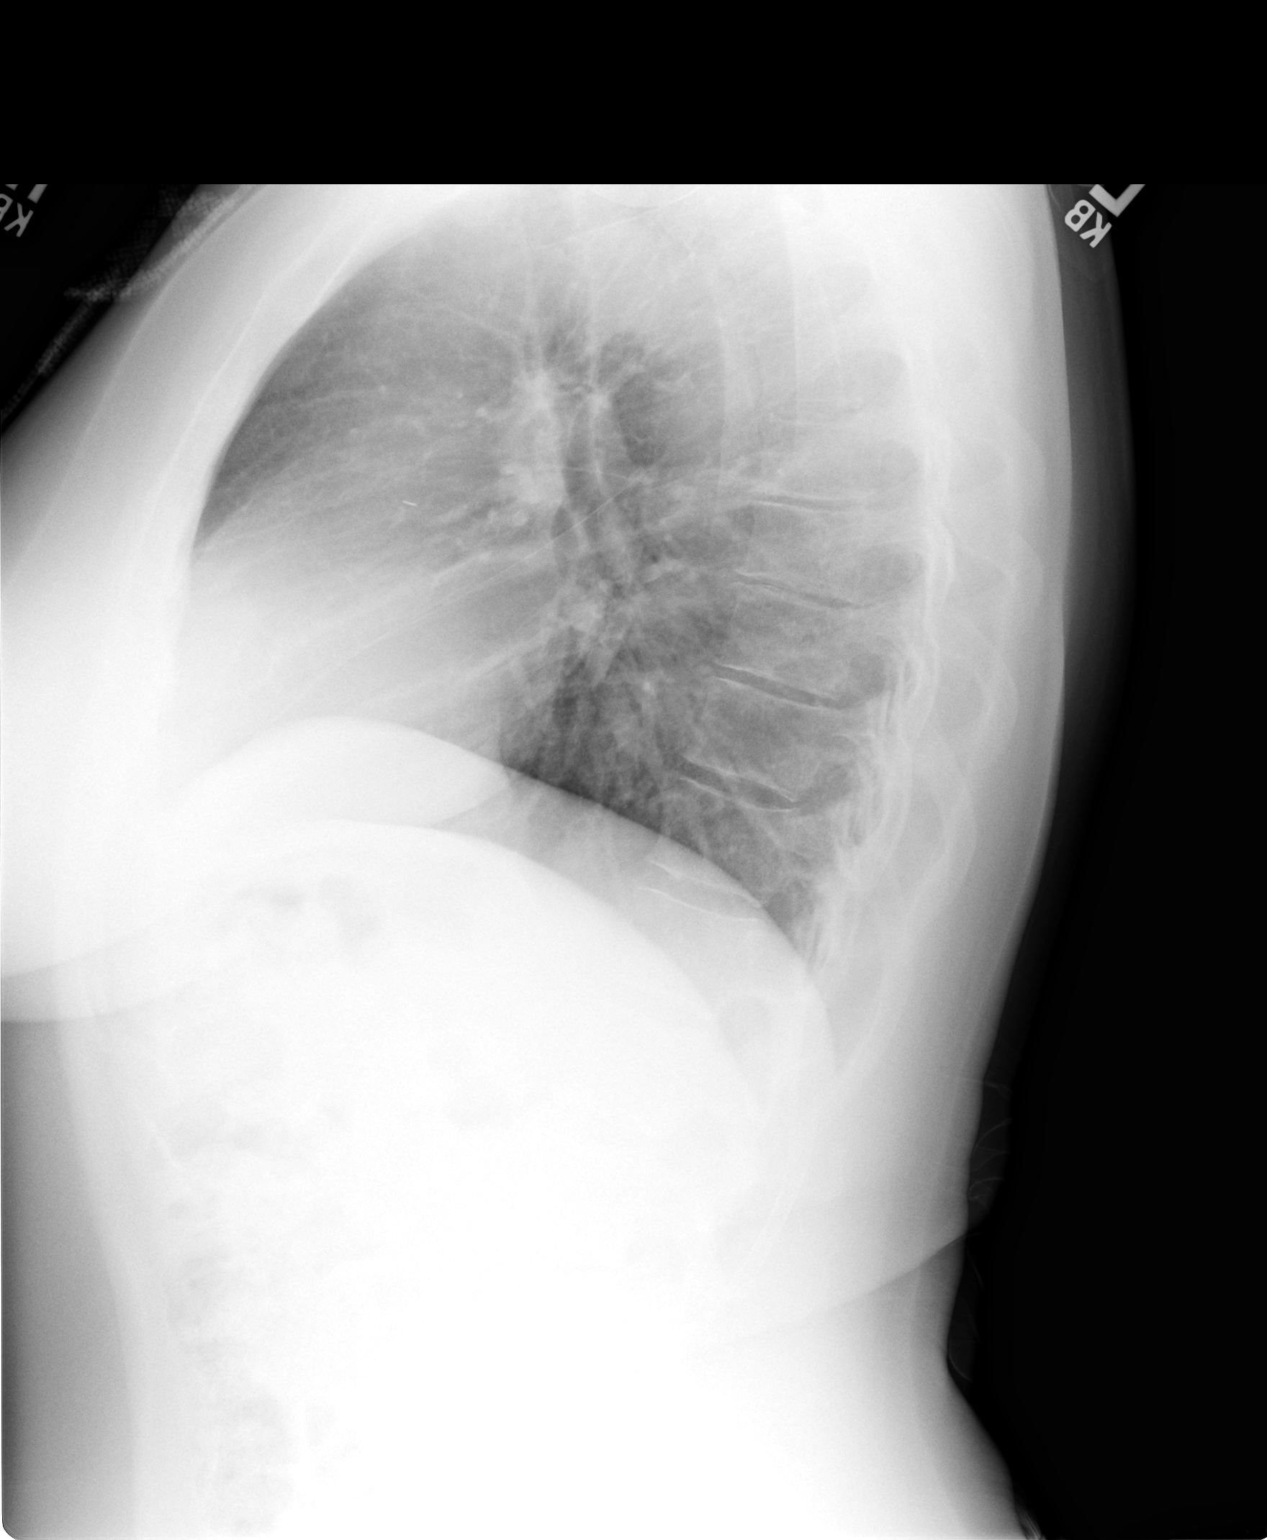

[2 of 2 positions shown; findings below may reference images not displayed]

FINDINGS: The lungs are clear.  Mediastinal contours are stable.
The heart is within upper limits of normal.  Artifacts overlie the
anterior chest.  No bony abnormality is seen.
IMPRESSION: No active lung disease.

## 2010-12-10 ENCOUNTER — Encounter: Payer: Self-pay | Admitting: Internal Medicine

## 2011-02-05 LAB — HEPATITIS PANEL, ACUTE
HCV Ab: NEGATIVE
Hepatitis B Surface Ag: NEGATIVE

## 2011-02-19 LAB — DIFFERENTIAL
Basophils Absolute: 0
Lymphocytes Relative: 30
Lymphs Abs: 1.6
Neutro Abs: 3.3
Neutrophils Relative %: 62

## 2011-02-19 LAB — CBC
HCT: 30.7 — ABNORMAL LOW
MCHC: 32.1
MCV: 78.7
RBC: 3.91
WBC: 5.4

## 2011-02-19 LAB — URINALYSIS, ROUTINE W REFLEX MICROSCOPIC
Ketones, ur: NEGATIVE
Nitrite: NEGATIVE
Specific Gravity, Urine: 1.026
pH: 6.5

## 2011-02-19 LAB — COMPREHENSIVE METABOLIC PANEL
BUN: 13
CO2: 26
Calcium: 9.1
Chloride: 106
Creatinine, Ser: 0.69
GFR calc non Af Amer: >60
Total Bilirubin: 0.3

## 2011-02-21 ENCOUNTER — Telehealth: Payer: Self-pay | Admitting: *Deleted

## 2011-02-21 ENCOUNTER — Ambulatory Visit: Payer: PRIVATE HEALTH INSURANCE | Admitting: Internal Medicine

## 2011-02-21 DIAGNOSIS — Z0000 Encounter for general adult medical examination without abnormal findings: Secondary | ICD-10-CM

## 2011-02-21 NOTE — Telephone Encounter (Signed)
CPE labs entered.  

## 2011-02-21 NOTE — Telephone Encounter (Signed)
Message copied by Merrilyn Puma on Fri Feb 21, 2011  3:28 PM ------      Message from: Earl Lagos      Created: Thu Feb 20, 2011  2:15 PM      Regarding: LABS       Please schedule labs for cpx and pt would like labs for her every three month cancer screening.

## 2011-04-11 ENCOUNTER — Ambulatory Visit (INDEPENDENT_AMBULATORY_CARE_PROVIDER_SITE_OTHER): Payer: PRIVATE HEALTH INSURANCE | Admitting: Internal Medicine

## 2011-04-11 DIAGNOSIS — I10 Essential (primary) hypertension: Secondary | ICD-10-CM

## 2011-04-11 MED ORDER — DESVENLAFAXINE SUCCINATE ER 50 MG PO TB24: 50.0000 mg | ORAL_TABLET | Freq: Every day | ORAL | Status: DC

## 2011-04-11 MED ORDER — OLMESARTAN-AMLODIPINE-HCTZ 40-5-12.5 MG PO TABS: 1.0000 | ORAL_TABLET | Freq: Every day | ORAL | Status: DC

## 2011-04-11 NOTE — Progress Notes (Signed)
  Subjective:    Patient ID: Sarah Waller, female    DOB: Dec 26, 1960, 50 y.o.   MRN: [id number]  HPI  C/o HTN, HAs   Review of Systems  Constitutional: Negative for chills, activity change, appetite change, fatigue and unexpected weight change.  HENT: Negative for congestion, mouth sores and sinus pressure.   Eyes: Negative for visual disturbance.  Respiratory: Negative for cough and chest tightness.   Gastrointestinal: Negative for nausea and abdominal pain.  Genitourinary: Negative for frequency, difficulty urinating and vaginal pain.  Musculoskeletal: Negative for back pain and gait problem.  Skin: Negative for pallor and rash.  Neurological: Positive for headaches. Negative for dizziness, tremors, weakness and numbness.  Psychiatric/Behavioral: Negative for suicidal ideas, confusion and sleep disturbance. The patient is nervous/anxious.        Objective:   Physical Exam  Constitutional: She appears well-developed and well-nourished. No distress.  HENT:  Head: Normocephalic.  Right Ear: External ear normal.  Left Ear: External ear normal.  Nose: Nose normal.  Mouth/Throat: Oropharynx is clear and moist.  Eyes: Conjunctivae are normal. Pupils are equal, round, and reactive to light. Right eye exhibits no discharge. Left eye exhibits no discharge.  Neck: Normal range of motion. Neck supple. No JVD present. No tracheal deviation present. No thyromegaly present.  Cardiovascular: Normal rate, regular rhythm and normal heart sounds.   Pulmonary/Chest: No stridor. No respiratory distress. She has no wheezes.  Abdominal: Soft. Bowel sounds are normal. She exhibits no distension and no mass. There is no tenderness. There is no rebound and no guarding.  Musculoskeletal: She exhibits no edema and no tenderness.  Lymphadenopathy:    She has no cervical adenopathy.  Neurological: She displays normal reflexes. No cranial nerve deficit. She exhibits normal muscle tone. Coordination  normal.  Skin: No rash noted. No erythema.  Psychiatric: She has a normal mood and affect. Her behavior is normal. Judgment and thought content normal.          Assessment & Plan:

## 2011-04-13 ENCOUNTER — Encounter: Payer: Self-pay | Admitting: Internal Medicine

## 2011-04-13 DIAGNOSIS — I10 Essential (primary) hypertension: Secondary | ICD-10-CM | POA: Insufficient documentation

## 2011-04-13 NOTE — Assessment & Plan Note (Signed)
It used to be diet controlled 11/12 - BP elevation is likely triggered by Pristique and Tamoxifen She may try to hold Pristique (Using for hot flashes) See Meds

## 2011-05-02 ENCOUNTER — Encounter: Payer: PRIVATE HEALTH INSURANCE | Admitting: Internal Medicine

## 2011-06-13 ENCOUNTER — Encounter: Payer: Self-pay | Admitting: Internal Medicine

## 2011-06-13 ENCOUNTER — Ambulatory Visit (INDEPENDENT_AMBULATORY_CARE_PROVIDER_SITE_OTHER): Payer: BC Managed Care – PPO | Admitting: Internal Medicine

## 2011-06-13 ENCOUNTER — Other Ambulatory Visit (INDEPENDENT_AMBULATORY_CARE_PROVIDER_SITE_OTHER): Payer: BC Managed Care – PPO

## 2011-06-13 VITALS — BP 160/110 | HR 90 | Temp 98.6°F | Resp 16 | Wt 174.0 lb

## 2011-06-13 DIAGNOSIS — M79676 Pain in unspecified toe(s): Secondary | ICD-10-CM | POA: Insufficient documentation

## 2011-06-13 DIAGNOSIS — Z0000 Encounter for general adult medical examination without abnormal findings: Secondary | ICD-10-CM

## 2011-06-13 DIAGNOSIS — M545 Low back pain, unspecified: Secondary | ICD-10-CM

## 2011-06-13 DIAGNOSIS — M79609 Pain in unspecified limb: Secondary | ICD-10-CM

## 2011-06-13 LAB — BASIC METABOLIC PANEL
BUN: 11 mg/dL (ref 6–23)
GFR: 120.72 mL/min (ref >60.00)
Potassium: 4.1 mEq/L (ref 3.5–5.1)
Sodium: 140 mEq/L (ref 135–145)

## 2011-06-13 LAB — URINALYSIS, ROUTINE W REFLEX MICROSCOPIC
Nitrite: NEGATIVE
Specific Gravity, Urine: 1.025 (ref 1.000–1.030)
Urobilinogen, UA: 0.2 (ref 0.0–1.0)

## 2011-06-13 LAB — CBC WITH DIFFERENTIAL/PLATELET
Eosinophils Relative: 0.6 % (ref 0.0–5.0)
HCT: 34.7 % — ABNORMAL LOW (ref 36.0–46.0)
Lymphs Abs: 1.7 10*3/uL (ref 0.7–4.0)
Monocytes Relative: 8.1 % (ref 3.0–12.0)
Platelets: 195 10*3/uL (ref 150.0–400.0)
RBC: 3.84 Mil/uL — ABNORMAL LOW (ref 3.87–5.11)
WBC: 5 10*3/uL (ref 4.5–10.5)

## 2011-06-13 LAB — LIPID PANEL
Cholesterol: 224 mg/dL — ABNORMAL HIGH (ref 0–200)
Total CHOL/HDL Ratio: 4
Triglycerides: 131 mg/dL (ref 0.0–149.0)
VLDL: 26.2 mg/dL (ref 0.0–40.0)

## 2011-06-13 LAB — HEPATIC FUNCTION PANEL
Albumin: 4.3 g/dL (ref 3.5–5.2)
Total Protein: 7 g/dL (ref 6.0–8.3)

## 2011-06-13 MED ORDER — LOSARTAN POTASSIUM-HCTZ 100-12.5 MG PO TABS: 1.0000 | ORAL_TABLET | Freq: Every day | ORAL | Status: DC

## 2011-06-13 NOTE — Assessment & Plan Note (Signed)
1/13 We discussed age appropriate health related issues, including available/recomended screening tests and vaccinations. We discussed a need for adhering to healthy diet and exercise. Labs/EKG were reviewed/ordered. All questions were answered.  Colonoscopy is up to date Declined shots Zostavax info given

## 2011-06-13 NOTE — Assessment & Plan Note (Signed)
Continue with current prescription therapy as reflected on the Med list.  

## 2011-06-13 NOTE — Progress Notes (Signed)
  Subjective:    Patient ID: Sarah Waller, female    DOB: July 23, 1960, 50 y.o.   MRN: [id number]  HPI  The patient is here for a wellness exam. The patient has been doing well overall without major physical or psychological issues going on lately. The patient needs to address  chronic hypertension that has been not controlled with medicines; to address chronic  hyperlipidemia controlled with medicines as well; and to address type 2 chronic pre-diabetes, controlled with medical treatment and diet. C/o HAs on Tribenzor: stopped Tibenzor 1 wk ago  Review of Systems  Constitutional: Negative for chills, activity change, appetite change, fatigue and unexpected weight change.  HENT: Negative for congestion, mouth sores and sinus pressure.   Eyes: Negative for visual disturbance.  Respiratory: Negative for cough and chest tightness.   Gastrointestinal: Negative for nausea and abdominal pain.  Genitourinary: Negative for frequency, difficulty urinating and vaginal pain.  Musculoskeletal: Negative for back pain and gait problem.  Skin: Negative for pallor and rash.  Neurological: Positive for headaches. Negative for dizziness, tremors, weakness and numbness.  Psychiatric/Behavioral: Negative for confusion and sleep disturbance. The patient is not nervous/anxious.        Objective:   Physical Exam  Constitutional: She appears well-developed. No distress.  HENT:  Head: Normocephalic.  Right Ear: External ear normal.  Left Ear: External ear normal.  Nose: Nose normal.  Mouth/Throat: Oropharynx is clear and moist.  Eyes: Conjunctivae are normal. Pupils are equal, round, and reactive to light. Right eye exhibits no discharge. Left eye exhibits no discharge.  Neck: Normal range of motion. Neck supple. No JVD present. No tracheal deviation present. No thyromegaly present.  Cardiovascular: Normal rate, regular rhythm and normal heart sounds.   Pulmonary/Chest: No stridor. No respiratory  distress. She has no wheezes.  Abdominal: Soft. Bowel sounds are normal. She exhibits no distension and no mass. There is no tenderness. There is no rebound and no guarding.  Musculoskeletal: She exhibits no edema and no tenderness.  Lymphadenopathy:    She has no cervical adenopathy.  Neurological: She displays normal reflexes. No cranial nerve deficit. She exhibits normal muscle tone. Coordination normal.  Skin: No rash noted. No erythema.  Psychiatric: She has a normal mood and affect. Her behavior is normal. Judgment and thought content normal.   Gr toe ganglion by exam       Assessment & Plan:

## 2011-06-13 NOTE — Assessment & Plan Note (Signed)
1/13 L - ganglion cyst Will aspirate if not resolved on it's own and inj w/steroids

## 2011-09-25 ENCOUNTER — Other Ambulatory Visit: Payer: Self-pay | Admitting: Obstetrics & Gynecology

## 2011-09-25 DIAGNOSIS — Z853 Personal history of malignant neoplasm of breast: Secondary | ICD-10-CM

## 2011-09-25 DIAGNOSIS — Z9889 Other specified postprocedural states: Secondary | ICD-10-CM

## 2011-10-03 ENCOUNTER — Other Ambulatory Visit: Payer: Self-pay | Admitting: Radiology

## 2011-10-03 DIAGNOSIS — Z853 Personal history of malignant neoplasm of breast: Secondary | ICD-10-CM

## 2011-10-03 DIAGNOSIS — Z9889 Other specified postprocedural states: Secondary | ICD-10-CM

## 2011-10-07 ENCOUNTER — Encounter: Payer: Self-pay | Admitting: Internal Medicine

## 2011-10-11 ENCOUNTER — Encounter: Payer: Self-pay | Admitting: Family Medicine

## 2011-10-11 ENCOUNTER — Ambulatory Visit (INDEPENDENT_AMBULATORY_CARE_PROVIDER_SITE_OTHER): Payer: BC Managed Care – PPO | Admitting: Family Medicine

## 2011-10-11 VITALS — BP 130/84 | HR 86 | Temp 98.5°F | Wt 166.0 lb

## 2011-10-11 DIAGNOSIS — R319 Hematuria, unspecified: Secondary | ICD-10-CM

## 2011-10-11 DIAGNOSIS — R109 Unspecified abdominal pain: Secondary | ICD-10-CM | POA: Insufficient documentation

## 2011-10-11 LAB — POCT URINALYSIS DIPSTICK
Ketones, UA: NEGATIVE
Spec Grav, UA: 1.01
pH, UA: 7.5

## 2011-10-11 NOTE — Patient Instructions (Signed)
Likely due to viral gastroenteritis. Push fluids. You can use gas X for gas pain.  Return to regular diet as soon as you  Can.  Call if not improving as expected in 4-5 days.

## 2011-10-11 NOTE — Progress Notes (Signed)
  Subjective:    Patient ID: Sarah Waller, female    DOB: 11/18/1960, 51 y.o.   MRN: [id number]  HPI  51 year old female pt of Dr. Leary Roca presents with new onset of left flank pain x 5 days. Loose stools not quite diarrhea, not frequent. Nausea, no vomiting, burping. Bloated.  No fever, no chills. No blood in stools. Feels better after bowel movement.  Starting yesterday pain is across upper abdomen.  No sick contacts.  Was camping last week.. Only bottled water, washed dishes with [employer].  Ate a lot of chicken , lamb, diet somewhat different. She has not had any improvement  Back on regular diet.  Has had some similar issue after eating ice cream in past but not lasting longer      Review of Systems  Constitutional: Negative for fever and fatigue.  HENT: Negative for ear pain.   Eyes: Negative for pain.  Respiratory: Negative for chest tightness and shortness of breath.   Cardiovascular: Negative for chest pain, palpitations and leg swelling.  Gastrointestinal: Positive for abdominal pain. Negative for blood in stool.  Genitourinary: Negative for dysuria and frequency.       Objective:   Physical Exam  Constitutional: Vital signs are normal. She appears well-developed and well-nourished. She is cooperative.  Non-toxic appearance. She does not appear ill. No distress.  HENT:  Head: Normocephalic.  Right Ear: Hearing, tympanic membrane, external ear and ear canal normal. Tympanic membrane is not erythematous, not retracted and not bulging.  Left Ear: Hearing, tympanic membrane, external ear and ear canal normal. Tympanic membrane is not erythematous, not retracted and not bulging.  Nose: No mucosal edema or rhinorrhea. Right sinus exhibits no maxillary sinus tenderness and no frontal sinus tenderness. Left sinus exhibits no maxillary sinus tenderness and no frontal sinus tenderness.  Mouth/Throat: Uvula is midline, oropharynx is clear and moist and mucous membranes  are normal.  Eyes: Conjunctivae, EOM and lids are normal. Pupils are equal, round, and reactive to light. No foreign bodies found.  Neck: Trachea normal and normal range of motion. Neck supple. Carotid bruit is not present. No mass and no thyromegaly present.  Cardiovascular: Normal rate, regular rhythm, S1 normal, S2 normal, normal heart sounds, intact distal pulses and normal pulses.  Exam reveals no gallop and no friction rub.   No murmur heard. Pulmonary/Chest: Effort normal and breath sounds normal. Not tachypneic. No respiratory distress. She has no decreased breath sounds. She has no wheezes. She has no rhonchi. She has no rales.  Abdominal: Soft. Normal appearance and bowel sounds are normal. There is no hepatosplenomegaly. There is tenderness in the left upper quadrant. There is no CVA tenderness. No hernia. Hernia confirmed negative in the ventral area, confirmed negative in the right inguinal area and confirmed negative in the left inguinal area.    Neurological: She is alert.  Skin: Skin is warm, dry and intact. No rash noted.  Psychiatric: Her speech is normal and behavior is normal. Judgment and thought content normal. Her mood appears not anxious. Cognition and memory are normal. She does not exhibit a depressed mood.          Assessment & Plan:

## 2011-10-12 LAB — URINE CULTURE: Colony Count: 85000

## 2011-10-14 NOTE — Assessment & Plan Note (Addendum)
UA positive but not typical for UTI related pain. Symptoms much more consistent with GI related pain. Will send for culture. COnsider renal stone if not improving.  Likely due to viral gastroenteritis. Pt refuses stool studies at this time. Push fluids.Symptomatic care.   Call if not improving as expected in 4-5 days.

## 2011-10-24 ENCOUNTER — Other Ambulatory Visit: Payer: BC Managed Care – PPO

## 2011-11-06 ENCOUNTER — Ambulatory Visit (HOSPITAL_BASED_OUTPATIENT_CLINIC_OR_DEPARTMENT_OTHER): Payer: BC Managed Care – PPO | Admitting: Hematology & Oncology

## 2011-11-06 ENCOUNTER — Other Ambulatory Visit: Payer: BC Managed Care – PPO | Admitting: Lab

## 2011-11-06 ENCOUNTER — Telehealth: Payer: Self-pay | Admitting: Hematology & Oncology

## 2011-11-06 VITALS — BP 150/89 | HR 73 | Temp 96.8°F | Ht 65.0 in | Wt 171.0 lb

## 2011-11-06 DIAGNOSIS — Z170 Estrogen receptor positive status [ER+]: Secondary | ICD-10-CM

## 2011-11-06 DIAGNOSIS — C50519 Malignant neoplasm of lower-outer quadrant of unspecified female breast: Secondary | ICD-10-CM

## 2011-11-06 DIAGNOSIS — C50919 Malignant neoplasm of unspecified site of unspecified female breast: Secondary | ICD-10-CM

## 2011-11-06 MED ORDER — ERGOCALCIFEROL 1.25 MG (50000 UT) PO CAPS: 50000.0000 [IU] | ORAL_CAPSULE | ORAL | Status: DC

## 2011-11-06 NOTE — Telephone Encounter (Signed)
Mailed June 2014 schedule

## 2011-11-06 NOTE — Progress Notes (Signed)
This office note has been dictated.

## 2011-11-07 NOTE — Progress Notes (Signed)
CC:   Georgina Quint. Plotnikov, MD Jeralyn Ruths, MD M. Leda Quail, MD Currie Paris, M.D.  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  CURRENT THERAPY:  The patient to be switched to Arimidex 1 mg p.o. daily.  She had been on tamoxifen 10 mg p.o. b.i.d.  INTERIM HISTORY:  Ms. Rabon comes in for her followup.  We last saw her back in April of 2012.  I am not sure why she had not come back sooner.  It will be almost 5 years now.  She is on tamoxifen.  She says tamoxifen causes a lot of hot flashes.  She now is on Pristiq, which has helped with her hot flashes.  She wants to try to come off tamoxifen.  I told her that the latest studies showed that patients on tamoxifen need to stay on 10 years.  We could probably switch her over to an aromatase inhibitor and keep her on this for another 3 years.  As such, we will move her to Arimidex.  She has been getting her mammograms.  She does have fairly dense breasts.  Her last mammogram was in May.  They did not see anything that was suspicious.  However, because of her dense breasts, they recommended annual MRI surveillance.  She has not had any problems with bone pain.  She is on vitamin D.  She had her last checked by Dr. Posey Rea back in February.  Of course, no vitamin D level was checked.  She did not want any lab work done by [place] today.  She has had no change in her medications. Again, she is on Pristiq.  PHYSICAL EXAMINATION:  This is a well-developed, well-nourished, white female in no obvious distress.  Vital signs:  Temperature of 96.6. Pulse is 73, respiratory 18, blood pressure 150/89.  Weight is 171. Head and neck:  Normocephalic, atraumatic skull.  There are no ocular or oral lesions.  There are no palpable cervical or supraclavicular lymph nodes.  Lungs:  Clear bilaterally.  Cardiac:  Regular rate and rhythm with a normal S1, S2.  There are no murmurs, rubs or bruits.   Breasts: Right breast with no masses, edema or erythema.  There is no right axillary adenopathy.  Left breast shows slight contraction from her lumpectomy and radiation.  She has a lumpectomy scar that is well-healed at the 4 o'clock position.  There is some slight firmness of the lumpectomy site.  She has no distinct mass in the left breast.  There is no left axillary adenopathy.  Abdomen:  Soft with good bowel sounds. There is no palpable abdominal mass.  There is no fluid wave.  There is no palpable hepatosplenomegaly.  Back:  No tenderness over the spine, ribs, or hips.  Extremities:  No clubbing, cyanosis or edema. Neurological:  No focal neurological deficits.  IMPRESSION:  Ms. Pallo is a 51 year old [place] female with history of stage I infiltrating ductal carcinoma left breast.  She was treated with tamoxifen.  She will be switched over to Arimidex now.  I will probably keep her on Arimidex for another 3 years.  I am still not convinced that she needs MRIs. She is on Arimidex.  I think this clearly is helpful with respect to prevention.  I believe that the digital mammograms that are done should be adequate for her.  We will plan to get her back in another year.  She sees other doctors. She feels that she  only needs to see [place] once a year since she is seeing other doctors.  I am certainly okay with this.  I did give her a prescription for vitamin D of 50,000 units weekly.  I spent about 40 minutes or so with her today.    ______________________________ Josph Macho, M.D. PRE/MEDQ  D:  11/06/2011  T:  11/07/2011  Job:  [id number]

## 2011-11-26 ENCOUNTER — Telehealth: Payer: Self-pay | Admitting: *Deleted

## 2011-11-26 DIAGNOSIS — C50919 Malignant neoplasm of unspecified site of unspecified female breast: Secondary | ICD-10-CM

## 2011-11-26 NOTE — Telephone Encounter (Signed)
Patient called wanting to have her labwork drawn at Dr. Loren Racer office.  Called Dr. Adah Perl office with orders for CMET, Vit D, LH, Estradiol, and FSH.  They stated patient doesn't have appt until August 2nd.  Notified patient of this.

## 2011-11-26 NOTE — Telephone Encounter (Signed)
Dr. Myna Hidalgo is requesting that we draw these labs for pt: CMET, vit d, LH, Estradiol, FSH. Please advise ok to order?

## 2011-11-26 NOTE — Telephone Encounter (Signed)
Labs entered.

## 2011-11-26 NOTE — Telephone Encounter (Signed)
Sure.  Thx.

## 2011-12-12 ENCOUNTER — Ambulatory Visit (INDEPENDENT_AMBULATORY_CARE_PROVIDER_SITE_OTHER): Payer: BC Managed Care – PPO | Admitting: Internal Medicine

## 2011-12-12 ENCOUNTER — Other Ambulatory Visit (INDEPENDENT_AMBULATORY_CARE_PROVIDER_SITE_OTHER): Payer: BC Managed Care – PPO

## 2011-12-12 ENCOUNTER — Encounter: Payer: Self-pay | Admitting: Internal Medicine

## 2011-12-12 VITALS — BP 130/90 | HR 80 | Temp 97.3°F | Wt 174.0 lb

## 2011-12-12 DIAGNOSIS — K219 Gastro-esophageal reflux disease without esophagitis: Secondary | ICD-10-CM

## 2011-12-12 DIAGNOSIS — R51 Headache: Secondary | ICD-10-CM

## 2011-12-12 DIAGNOSIS — C50919 Malignant neoplasm of unspecified site of unspecified female breast: Secondary | ICD-10-CM

## 2011-12-12 DIAGNOSIS — M545 Low back pain, unspecified: Secondary | ICD-10-CM

## 2011-12-12 DIAGNOSIS — G43019 Migraine without aura, intractable, without status migrainosus: Secondary | ICD-10-CM

## 2011-12-12 DIAGNOSIS — I10 Essential (primary) hypertension: Secondary | ICD-10-CM

## 2011-12-12 LAB — COMPREHENSIVE METABOLIC PANEL
BUN: 15 mg/dL (ref 6–23)
CO2: 29 mEq/L (ref 19–32)
GFR: 103.28 mL/min (ref >60.00)
Glucose, Bld: 121 mg/dL — ABNORMAL HIGH (ref 70–99)
Sodium: 141 mEq/L (ref 135–145)
Total Bilirubin: 0.4 mg/dL (ref 0.3–1.2)
Total Protein: 6.7 g/dL (ref 6.0–8.3)

## 2011-12-12 LAB — FOLLICLE STIMULATING HORMONE: FSH: 23 m[IU]/mL

## 2011-12-12 LAB — LUTEINIZING HORMONE: LH: 12.29 m[IU]/mL

## 2011-12-12 MED ORDER — AMLODIPINE-OLMESARTAN 5-40 MG PO TABS: 1.0000 | ORAL_TABLET | Freq: Every day | ORAL | Status: DC

## 2011-12-12 NOTE — Assessment & Plan Note (Signed)
Continue with current prescription therapy as reflected on the Med list.  

## 2011-12-12 NOTE — Assessment & Plan Note (Signed)
Meds prn 

## 2011-12-12 NOTE — Progress Notes (Signed)
   Subjective:    Patient ID: Sarah Waller, female    DOB: [birth year], 51 y.o.   MRN: [id number]  HPI  The patient is here for a wellness exam. The patient has been doing well overall without major physical or psychological issues going on lately.  The patient needs to address  chronic hypertension that has been not controlled with medicines; to address chronic  hyperlipidemia controlled with medicines as well; and to address type 2 chronic pre-diabetes, controlled with medical treatment and diet. C/o HAs on Tribenzor: stopped Tibenzor 1 wk ago  BP Readings from Last 3 Encounters:  12/12/11 130/90  11/06/11 150/89  10/11/11 130/84   Wt Readings from Last 3 Encounters:  12/12/11 174 lb (78.926 kg)  11/06/11 171 lb (77.565 kg)  10/11/11 166 lb (75.297 kg)      Review of Systems  Constitutional: Negative for chills, activity change, appetite change, fatigue and unexpected weight change.  HENT: Negative for congestion, mouth sores and sinus pressure.   Eyes: Negative for visual disturbance.  Respiratory: Negative for cough and chest tightness.   Gastrointestinal: Negative for nausea and abdominal pain.  Genitourinary: Negative for frequency, difficulty urinating and vaginal pain.  Musculoskeletal: Negative for back pain and gait problem.  Skin: Negative for pallor and rash.  Neurological: Positive for headaches. Negative for dizziness, tremors, weakness and numbness.  Psychiatric/Behavioral: Negative for confusion and disturbed wake/sleep cycle. The patient is not nervous/anxious.        Objective:   Physical Exam  Constitutional: She appears well-developed. No distress.  HENT:  Head: Normocephalic.  Right Ear: External ear normal.  Left Ear: External ear normal.  Nose: Nose normal.  Mouth/Throat: Oropharynx is clear and moist.  Eyes: Conjunctivae are normal. Pupils are equal, round, and reactive to light. Right eye exhibits no discharge. Left eye exhibits no discharge.   Neck: Normal range of motion. Neck supple. No JVD present. No tracheal deviation present. No thyromegaly present.  Cardiovascular: Normal rate, regular rhythm and normal heart sounds.   Pulmonary/Chest: No stridor. No respiratory distress. She has no wheezes.  Abdominal: Soft. Bowel sounds are normal. She exhibits no distension and no mass. There is no tenderness. There is no rebound and no guarding.  Musculoskeletal: She exhibits no edema and no tenderness.  Lymphadenopathy:    She has no cervical adenopathy.  Neurological: She displays normal reflexes. No cranial nerve deficit. She exhibits normal muscle tone. Coordination normal.  Skin: No rash noted. No erythema.  Psychiatric: She has a normal mood and affect. Her behavior is normal. Judgment and thought content normal.   Gr toe ganglion by exam  Lab Results  Component Value Date   WBC 5.0 06/13/2011   HGB 11.8* 06/13/2011   HCT 34.7* 06/13/2011   PLT 195.0 06/13/2011   GLUCOSE 79 06/13/2011   CHOL 224* 06/13/2011   TRIG 131.0 06/13/2011   HDL 57.40 06/13/2011   LDLDIRECT 145.3 06/13/2011   ALT 19 06/13/2011   AST 24 06/13/2011   NA 140 06/13/2011   K 4.1 06/13/2011   CL 105 06/13/2011   CREATININE 0.6 06/13/2011   BUN 11 06/13/2011   CO2 27 06/13/2011   TSH 1.90 06/13/2011   INR 0.9 ratio 09/05/2009        Assessment & Plan:

## 2011-12-14 ENCOUNTER — Telehealth: Payer: Self-pay | Admitting: Internal Medicine

## 2011-12-14 ENCOUNTER — Encounter: Payer: Self-pay | Admitting: Internal Medicine

## 2011-12-14 MED ORDER — ERGOCALCIFEROL 1.25 MG (50000 UT) PO CAPS: 50000.0000 [IU] | ORAL_CAPSULE | ORAL | Status: DC

## 2011-12-14 NOTE — Telephone Encounter (Signed)
Sarah Waller, please, inform patient that all labs are normal except for elev glu and low vit D3. Start Vit D, cut back on carbs Thx

## 2011-12-15 NOTE — Telephone Encounter (Signed)
Left mess for patient to call back.  

## 2011-12-16 NOTE — Telephone Encounter (Signed)
Pt informed

## 2012-01-05 ENCOUNTER — Encounter: Payer: Self-pay | Admitting: Internal Medicine

## 2012-01-05 ENCOUNTER — Ambulatory Visit (INDEPENDENT_AMBULATORY_CARE_PROVIDER_SITE_OTHER): Payer: BC Managed Care – PPO | Admitting: Internal Medicine

## 2012-01-05 VITALS — BP 120/80 | HR 85 | Temp 98.5°F | Wt 176.0 lb

## 2012-01-05 DIAGNOSIS — J45909 Unspecified asthma, uncomplicated: Secondary | ICD-10-CM

## 2012-01-05 DIAGNOSIS — R05 Cough: Secondary | ICD-10-CM

## 2012-01-05 DIAGNOSIS — R059 Cough, unspecified: Secondary | ICD-10-CM

## 2012-01-05 DIAGNOSIS — I10 Essential (primary) hypertension: Secondary | ICD-10-CM

## 2012-01-05 MED ORDER — MOMETASONE FURO-FORMOTEROL FUM 200-5 MCG/ACT IN AERO: 2.0000 | INHALATION_SPRAY | Freq: Two times a day (BID) | RESPIRATORY_TRACT | Status: DC

## 2012-01-05 MED ORDER — METHYLPREDNISOLONE ACETATE 80 MG/ML IJ SUSP
120.0000 mg | Freq: Once | INTRAMUSCULAR | Status: AC
  Administered 2012-01-05: 120 mg via INTRAMUSCULAR

## 2012-01-05 MED ORDER — OLMESARTAN MEDOXOMIL 40 MG PO TABS: 40.0000 mg | ORAL_TABLET | Freq: Every day | ORAL | Status: DC

## 2012-01-05 MED ORDER — PROMETHAZINE-CODEINE 6.25-10 MG/5ML PO SYRP: 5.0000 mL | ORAL_SOLUTION | ORAL | Status: AC | PRN

## 2012-01-05 MED ORDER — PREDNISONE 10 MG PO TABS: ORAL_TABLET | ORAL | Status: DC

## 2012-01-05 NOTE — Progress Notes (Signed)
   Subjective:    Patient ID: Sarah Waller, female    DOB: February 06, 1961, 51 y.o.   MRN: [id number]  Cough This is a new problem. The current episode started in the past 7 days. The problem has been gradually worsening. The problem occurs every few minutes. Associated symptoms include headaches, nasal congestion and wheezing. Pertinent negatives include no chills or rash. Her past medical history is significant for asthma.  Asthma She complains of cough and wheezing. Associated symptoms include headaches and nasal congestion. Pertinent negatives include no appetite change. Her past medical history is significant for asthma.    . C/o HAs on Tribenzor: stopped Tibenzor - [place]  BP Readings from Last 3 Encounters:  01/05/12 120/80  12/12/11 130/90  11/06/11 150/89   Wt Readings from Last 3 Encounters:  01/05/12 176 lb (79.833 kg)  12/12/11 174 lb (78.926 kg)  11/06/11 171 lb (77.565 kg)      Review of Systems  Constitutional: Negative for chills, activity change, appetite change, fatigue and unexpected weight change.  HENT: Negative for congestion, mouth sores and sinus pressure.   Eyes: Negative for visual disturbance.  Respiratory: Positive for cough and wheezing. Negative for chest tightness.   Gastrointestinal: Negative for nausea and abdominal pain.  Genitourinary: Negative for frequency, difficulty urinating and vaginal pain.  Musculoskeletal: Negative for back pain and gait problem.  Skin: Negative for pallor and rash.  Neurological: Positive for headaches. Negative for dizziness, tremors, weakness and numbness.  Psychiatric/Behavioral: Negative for confusion and disturbed wake/sleep cycle. The patient is not nervous/anxious.        Objective:   Physical Exam  Constitutional: She appears well-developed. No distress.       Obese  HENT:  Head: Normocephalic.  Right Ear: External ear normal.  Left Ear: External ear normal.  Nose: Nose normal.  Mouth/Throat:  Oropharynx is clear and moist.  Eyes: Conjunctivae are normal. Pupils are equal, round, and reactive to light. Right eye exhibits no discharge. Left eye exhibits no discharge.  Neck: Normal range of motion. Neck supple. No JVD present. No tracheal deviation present. No thyromegaly present.  Cardiovascular: Normal rate, regular rhythm and normal heart sounds.   Pulmonary/Chest: No stridor. No respiratory distress. She has wheezes.  Abdominal: Soft. Bowel sounds are normal. She exhibits no distension and no mass. There is no tenderness. There is no rebound and no guarding.  Musculoskeletal: She exhibits no edema and no tenderness.  Lymphadenopathy:    She has no cervical adenopathy.  Neurological: She displays normal reflexes. No cranial nerve deficit. She exhibits normal muscle tone. Coordination normal.  Skin: No rash noted. No erythema.  Psychiatric: She has a normal mood and affect. Her behavior is normal. Judgment and thought content normal.    Lab Results  Component Value Date   WBC 5.0 06/13/2011   HGB 11.8* 06/13/2011   HCT 34.7* 06/13/2011   PLT 195.0 06/13/2011   GLUCOSE 121* 12/12/2011   CHOL 224* 06/13/2011   TRIG 131.0 06/13/2011   HDL 57.40 06/13/2011   LDLDIRECT 145.3 06/13/2011   ALT 18 12/12/2011   AST 24 12/12/2011   NA 141 12/12/2011   K 3.6 12/12/2011   CL 105 12/12/2011   CREATININE 0.6 12/12/2011   BUN 15 12/12/2011   CO2 29 12/12/2011   TSH 1.90 06/13/2011   INR 0.9 ratio 09/05/2009        Assessment & Plan:

## 2012-01-05 NOTE — Assessment & Plan Note (Signed)
  8/13 she is worrying about amlodipine/breast ca risk article Changed to Benicar

## 2012-01-05 NOTE — Assessment & Plan Note (Signed)
Depomedrol 120 mg If worse: Prednisone 10 mg: take 4 tabs a day x 3 days; then 3 tabs a day x 4 days; then 2 tabs a day x 4 days, then 1 tab a day x 6 days, then stop. Take pc.  Prom/cod

## 2012-01-22 ENCOUNTER — Other Ambulatory Visit: Payer: Self-pay | Admitting: *Deleted

## 2012-01-22 DIAGNOSIS — C50919 Malignant neoplasm of unspecified site of unspecified female breast: Secondary | ICD-10-CM

## 2012-01-22 MED ORDER — ANASTROZOLE 1 MG PO TABS: 1.0000 mg | ORAL_TABLET | Freq: Every day | ORAL | Status: DC

## 2012-01-22 NOTE — Telephone Encounter (Signed)
Pt called stating she was to have a rx for Arimidex for called to her pharmacy. Reviewed Dr Gustavo Lah note from June. Confirmed rx request. Sent via e-prescribe to [facility].

## 2012-02-14 ENCOUNTER — Other Ambulatory Visit: Payer: Self-pay | Admitting: Internal Medicine

## 2012-02-16 ENCOUNTER — Telehealth: Payer: Self-pay | Admitting: Internal Medicine

## 2012-02-16 NOTE — Telephone Encounter (Signed)
Caller: [name]/Patient; Phone: [phone]; Reason for Call: Patient calling to get another prescription, she was not sure on how to spell the name of the medication.  States she lost the prescription and wants to know if Dr.  Posey Rea would call it into [facility] [phone].  Thanks

## 2012-02-16 NOTE — Telephone Encounter (Signed)
Pls let me know what it is Thx

## 2012-02-16 NOTE — Telephone Encounter (Signed)
Should pt still be taking this dose?

## 2012-02-18 ENCOUNTER — Other Ambulatory Visit: Payer: Self-pay | Admitting: Internal Medicine

## 2012-02-19 NOTE — Telephone Encounter (Signed)
Ok to Rf? 

## 2012-02-26 ENCOUNTER — Telehealth: Payer: Self-pay | Admitting: Internal Medicine

## 2012-02-26 MED ORDER — DESVENLAFAXINE SUCCINATE ER 50 MG PO TB24: 50.0000 mg | ORAL_TABLET | Freq: Every day | ORAL | Status: DC

## 2012-03-24 NOTE — Telephone Encounter (Signed)
x

## 2012-03-29 ENCOUNTER — Telehealth: Payer: Self-pay | Admitting: *Deleted

## 2012-03-29 MED ORDER — DESVENLAFAXINE SUCCINATE ER 50 MG PO TB24: 50.0000 mg | ORAL_TABLET | Freq: Every day | ORAL | Status: DC

## 2012-03-29 NOTE — Telephone Encounter (Signed)
PA for Pristiq approved 03/26/12 until 12/20/2014. Pharmacy informed.

## 2012-04-20 ENCOUNTER — Other Ambulatory Visit: Payer: Self-pay | Admitting: *Deleted

## 2012-04-20 MED ORDER — DESVENLAFAXINE SUCCINATE ER 50 MG PO TB24: 50.0000 mg | ORAL_TABLET | Freq: Every day | ORAL | Status: DC

## 2012-06-01 ENCOUNTER — Ambulatory Visit: Payer: BC Managed Care – PPO | Admitting: Internal Medicine

## 2012-06-02 ENCOUNTER — Ambulatory Visit: Payer: BC Managed Care – PPO | Admitting: Internal Medicine

## 2012-06-04 ENCOUNTER — Other Ambulatory Visit (INDEPENDENT_AMBULATORY_CARE_PROVIDER_SITE_OTHER): Payer: PRIVATE HEALTH INSURANCE

## 2012-06-04 ENCOUNTER — Ambulatory Visit (INDEPENDENT_AMBULATORY_CARE_PROVIDER_SITE_OTHER): Payer: PRIVATE HEALTH INSURANCE | Admitting: Internal Medicine

## 2012-06-04 ENCOUNTER — Encounter: Payer: Self-pay | Admitting: Internal Medicine

## 2012-06-04 VITALS — BP 130/80 | HR 80 | Temp 98.3°F | Ht 66.0 in | Wt 180.5 lb

## 2012-06-04 DIAGNOSIS — Z0000 Encounter for general adult medical examination without abnormal findings: Secondary | ICD-10-CM

## 2012-06-04 DIAGNOSIS — R209 Unspecified disturbances of skin sensation: Secondary | ICD-10-CM

## 2012-06-04 DIAGNOSIS — K219 Gastro-esophageal reflux disease without esophagitis: Secondary | ICD-10-CM

## 2012-06-04 DIAGNOSIS — G43019 Migraine without aura, intractable, without status migrainosus: Secondary | ICD-10-CM

## 2012-06-04 DIAGNOSIS — I10 Essential (primary) hypertension: Secondary | ICD-10-CM

## 2012-06-04 DIAGNOSIS — Z853 Personal history of malignant neoplasm of breast: Secondary | ICD-10-CM

## 2012-06-04 DIAGNOSIS — R202 Paresthesia of skin: Secondary | ICD-10-CM

## 2012-06-04 DIAGNOSIS — E559 Vitamin D deficiency, unspecified: Secondary | ICD-10-CM

## 2012-06-04 LAB — LIPID PANEL
Cholesterol: 251 mg/dL — ABNORMAL HIGH (ref 0–200)
Total CHOL/HDL Ratio: 5
Triglycerides: 114 mg/dL (ref 0.0–149.0)
VLDL: 22.8 mg/dL (ref 0.0–40.0)

## 2012-06-04 LAB — CBC WITH DIFFERENTIAL/PLATELET
Basophils Relative: 0.9 % (ref 0.0–3.0)
Eosinophils Absolute: 0.1 10*3/uL (ref 0.0–0.7)
Hemoglobin: 12.9 g/dL (ref 12.0–15.0)
Lymphocytes Relative: 29.4 % (ref 12.0–46.0)
MCHC: 34 g/dL (ref 30.0–36.0)
Monocytes Relative: 9.3 % (ref 3.0–12.0)
Neutro Abs: 2.4 10*3/uL (ref 1.4–7.7)
RBC: 4.31 Mil/uL (ref 3.87–5.11)

## 2012-06-04 LAB — BASIC METABOLIC PANEL
CO2: 30 mEq/L (ref 19–32)
Calcium: 9.9 mg/dL (ref 8.4–10.5)
Chloride: 105 mEq/L (ref 96–112)
Sodium: 142 mEq/L (ref 135–145)

## 2012-06-04 LAB — URINALYSIS, ROUTINE W REFLEX MICROSCOPIC
Ketones, ur: NEGATIVE
Specific Gravity, Urine: 1.02 (ref 1.000–1.030)
Total Protein, Urine: NEGATIVE
Urine Glucose: NEGATIVE
pH: 6.5 (ref 5.0–8.0)

## 2012-06-04 LAB — TSH: TSH: 2.15 u[IU]/mL (ref 0.35–5.50)

## 2012-06-04 LAB — HEPATIC FUNCTION PANEL
AST: 24 U/L (ref 0–37)
Albumin: 4.3 g/dL (ref 3.5–5.2)

## 2012-06-04 LAB — LDL CHOLESTEROL, DIRECT: Direct LDL: 157.3 mg/dL

## 2012-06-04 LAB — VITAMIN B12: Vitamin B-12: 386 pg/mL (ref 211–911)

## 2012-06-04 MED ORDER — DICLOFENAC SODIUM 1 % TD GEL: 4.0000 g | Freq: Four times a day (QID) | TRANSDERMAL | Status: DC

## 2012-06-04 MED ORDER — OLMESARTAN MEDOXOMIL 40 MG PO TABS: 40.0000 mg | ORAL_TABLET | Freq: Every day | ORAL | Status: DC

## 2012-06-04 MED ORDER — VITAMIN D (ERGOCALCIFEROL) 1.25 MG (50000 UNIT) PO CAPS: 50000.0000 [IU] | ORAL_CAPSULE | ORAL | Status: DC

## 2012-06-04 MED ORDER — DESVENLAFAXINE SUCCINATE ER 50 MG PO TB24: 50.0000 mg | ORAL_TABLET | Freq: Every day | ORAL | Status: DC

## 2012-06-04 NOTE — Assessment & Plan Note (Signed)
Continue with current prescription therapy as reflected on the Med list.  

## 2012-06-04 NOTE — Assessment & Plan Note (Signed)
Better  

## 2012-06-04 NOTE — Assessment & Plan Note (Signed)
1/13 We discussed age appropriate health related issues, including available/recomended screening tests and vaccinations. We discussed a need for adhering to healthy diet and exercise. Labs/EKG were reviewed/ordered. All questions were answered.  Colonoscopy is up to date

## 2012-06-04 NOTE — Progress Notes (Signed)
   Subjective:     HPI  The patient is here for a wellness exam. The patient has been doing well overall without major physical or psychological issues going on lately.  The patient needs to address  chronic hypertension that has been not controlled with medicines; to address chronic  hyperlipidemia controlled with medicines as well; and to address type 2 chronic pre-diabetes, controlled with medical treatment and diet. C/o HAs on Tribenzor: stopped Tibenzor 1 wk ago  BP Readings from Last 3 Encounters:  06/04/12 130/80  01/05/12 120/80  12/12/11 130/90   Wt Readings from Last 3 Encounters:  06/04/12 180 lb 8 oz (81.874 kg)  01/05/12 176 lb (79.833 kg)  12/12/11 174 lb (78.926 kg)      Review of Systems  Constitutional: Negative for chills, activity change, appetite change, fatigue and unexpected weight change.  HENT: Negative for congestion, mouth sores, neck pain, postnasal drip and sinus pressure.   Eyes: Negative for visual disturbance.  Respiratory: Negative for cough and chest tightness.   Gastrointestinal: Negative for nausea and abdominal pain.  Genitourinary: Negative for frequency, difficulty urinating and vaginal pain.  Musculoskeletal: Negative for back pain and gait problem.  Skin: Negative for pallor and rash.  Neurological: Positive for headaches. Negative for dizziness, tremors, weakness and numbness.  Psychiatric/Behavioral: Negative for suicidal ideas, hallucinations, confusion, sleep disturbance and dysphoric mood. The patient is not nervous/anxious.        Objective:   Physical Exam  Constitutional: She appears well-developed. No distress.       Obese  HENT:  Head: Normocephalic.  Right Ear: External ear normal.  Left Ear: External ear normal.  Nose: Nose normal.  Mouth/Throat: Oropharynx is clear and moist.  Eyes: Conjunctivae normal are normal. Pupils are equal, round, and reactive to light. Right eye exhibits no discharge. Left eye exhibits no  discharge.  Neck: Normal range of motion. Neck supple. No JVD present. No tracheal deviation present. No thyromegaly present.  Cardiovascular: Normal rate, regular rhythm and normal heart sounds.   Pulmonary/Chest: No stridor. No respiratory distress. She has no wheezes.  Abdominal: Soft. Bowel sounds are normal. She exhibits no distension and no mass. There is no tenderness. There is no rebound and no guarding.  Musculoskeletal: She exhibits no edema and no tenderness.  Lymphadenopathy:    She has no cervical adenopathy.  Neurological: She displays normal reflexes. No cranial nerve deficit. She exhibits normal muscle tone. Coordination normal.  Skin: No rash noted. No erythema.  Psychiatric: She has a normal mood and affect. Her behavior is normal. Judgment and thought content normal.   Gr toe ganglion by exam  Lab Results  Component Value Date   WBC 5.0 06/13/2011   HGB 11.8* 06/13/2011   HCT 34.7* 06/13/2011   PLT 195.0 06/13/2011   GLUCOSE 121* 12/12/2011   CHOL 224* 06/13/2011   TRIG 131.0 06/13/2011   HDL 57.40 06/13/2011   LDLDIRECT 145.3 06/13/2011   ALT 18 12/12/2011   AST 24 12/12/2011   NA 141 12/12/2011   K 3.6 12/12/2011   CL 105 12/12/2011   CREATININE 0.6 12/12/2011   BUN 15 12/12/2011   CO2 29 12/12/2011   TSH 1.90 06/13/2011   INR 0.9 ratio 09/05/2009        Assessment & Plan:

## 2012-06-05 LAB — VITAMIN D 25 HYDROXY (VIT D DEFICIENCY, FRACTURES): Vit D, 25-Hydroxy: 33 ng/mL (ref 30–89)

## 2012-06-07 ENCOUNTER — Telehealth: Payer: Self-pay | Admitting: Internal Medicine

## 2012-06-07 NOTE — Telephone Encounter (Signed)
Sarah Waller, please, inform patient that all labs are normal except for a borderline low vit D - 33, - take a higher dose daily. Elev chol - low fat diet Thx

## 2012-06-07 NOTE — Telephone Encounter (Signed)
Left mess for patient to call back.  

## 2012-06-08 NOTE — Telephone Encounter (Signed)
Pt informed

## 2012-06-09 ENCOUNTER — Encounter: Payer: Self-pay | Admitting: Internal Medicine

## 2012-06-22 ENCOUNTER — Ambulatory Visit: Payer: PRIVATE HEALTH INSURANCE | Admitting: Internal Medicine

## 2012-06-25 ENCOUNTER — Ambulatory Visit: Payer: PRIVATE HEALTH INSURANCE | Admitting: Internal Medicine

## 2012-06-25 ENCOUNTER — Encounter: Payer: BC Managed Care – PPO | Admitting: Internal Medicine

## 2012-07-02 ENCOUNTER — Encounter: Payer: Self-pay | Admitting: Internal Medicine

## 2012-07-02 ENCOUNTER — Ambulatory Visit (INDEPENDENT_AMBULATORY_CARE_PROVIDER_SITE_OTHER): Payer: PRIVATE HEALTH INSURANCE | Admitting: Internal Medicine

## 2012-07-02 VITALS — BP 122/80 | HR 76 | Temp 98.1°F | Resp 16

## 2012-07-02 DIAGNOSIS — M79601 Pain in right arm: Secondary | ICD-10-CM

## 2012-07-02 DIAGNOSIS — M25539 Pain in unspecified wrist: Secondary | ICD-10-CM

## 2012-07-02 DIAGNOSIS — M25531 Pain in right wrist: Secondary | ICD-10-CM

## 2012-07-02 DIAGNOSIS — M79609 Pain in unspecified limb: Secondary | ICD-10-CM

## 2012-07-02 NOTE — Progress Notes (Signed)
Patient ID: Sarah Waller, female   DOB: [birth year], 52 y.o.   MRN: [id number]  C/o L thumb pain and R deltoid pain   Procedure Note :    Trigger Point Injection:   Indication : Focal tender area identifiable by the location without other identifiable neurologic or musculoskeletal finding or pathology.   Risks including unsuccessful procedure , bleeding, infection, bruising, skin atrophy and others were explained to the patient in detail as well as the benefits. Informed consent was obtained and signed.   Tthe patient was placed in a comfortable position. 4 points of maximum tenderness over R deltoid and triceps muscles were marked and  the skin was prepped with Betadine and alcohol. 1 inch 25-gauge needle was used. The needle was advanced perpendicular to the skin. Each trigger point was injected with 1 mL of 2% lidocaine and 10 mg of Depo-Medrol in a usual fashion.  Band-Aids applied.   Tolerated well. Complications: None. Good pain relief following the procedure.     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   Risks including unsuccessful procedure , bleeding, infection, bruising, skin atrophy and others were explained to the patient in detail as well as the benefits. Informed consent was obtained and signed.   Tthe patient was placed in a comfortable position. Skin was prepped with Betadine and alcohol  and anesthetized with a cooling spray. Then, a 5 cc syringe with a 1.5 inch long 25-gauge needle was used for a tendon injection with 1 mL of 2% lidocaine and 20 mg of Depo-Medrol .  Band-Aid was applied.     Tolerated well. Complications: None. Good pain relief following the procedure.   Postprocedure instructions :    A Band-Aid should be left on for 12 hours. Injection therapy is not a cure itself. It is used in conjunction with other modalities. You can use nonsteroidal  anti-inflammatories like ibuprofen  hot and cold compresses. Rest is recommended in the next 24 hours. You need to report immediately  if fever, chills or any signs of infection develop.

## 2012-07-02 NOTE — Patient Instructions (Addendum)
Postprocedure instructions :    A Band-Aid should be left on for 12 hours. Injection therapy is not a cure itself. It is used in conjunction with other modalities. You can use nonsteroidal anti-inflammatories like ibuprofen hot and cold compresses. Rest is recommended in the next 24 hours. You need to report immediately  if fever, chills or any signs of infection develop.  

## 2012-07-13 DIAGNOSIS — M79603 Pain in arm, unspecified: Secondary | ICD-10-CM | POA: Insufficient documentation

## 2012-07-13 MED ORDER — METHYLPREDNISOLONE ACETATE 80 MG/ML IJ SUSP: 80.0000 mg | Freq: Once | INTRAMUSCULAR | Status: DC

## 2012-07-13 NOTE — Assessment & Plan Note (Signed)
See procedures

## 2012-08-03 ENCOUNTER — Encounter: Payer: Self-pay | Admitting: Hematology & Oncology

## 2012-08-03 NOTE — Progress Notes (Signed)
Received prior auth approved #[id number] for Anastrozole, approved for 3 years. Faxed a copy to Amy and forward to medical records.

## 2012-08-05 ENCOUNTER — Telehealth: Payer: Self-pay | Admitting: Hematology & Oncology

## 2012-08-05 NOTE — Telephone Encounter (Signed)
Pt moved 6-26 to 5-30 insisted on lab. She is aware to get lab here in our office

## 2012-08-17 ENCOUNTER — Telehealth: Payer: Self-pay | Admitting: *Deleted

## 2012-08-17 NOTE — Telephone Encounter (Signed)
OK. Thx

## 2012-08-17 NOTE — Telephone Encounter (Signed)
Pt left vm requesting to know if her best friend can establish care with you. Please advise.

## 2012-08-18 ENCOUNTER — Encounter: Payer: Self-pay | Admitting: Obstetrics & Gynecology

## 2012-08-18 ENCOUNTER — Ambulatory Visit (INDEPENDENT_AMBULATORY_CARE_PROVIDER_SITE_OTHER): Payer: No Typology Code available for payment source | Admitting: Obstetrics & Gynecology

## 2012-08-18 VITALS — BP 180/82 | Ht 65.75 in | Wt 178.2 lb

## 2012-08-18 DIAGNOSIS — R140 Abdominal distension (gaseous): Secondary | ICD-10-CM

## 2012-08-18 DIAGNOSIS — E559 Vitamin D deficiency, unspecified: Secondary | ICD-10-CM

## 2012-08-18 DIAGNOSIS — R141 Gas pain: Secondary | ICD-10-CM

## 2012-08-18 DIAGNOSIS — R142 Eructation: Secondary | ICD-10-CM

## 2012-08-18 DIAGNOSIS — Z01419 Encounter for gynecological examination (general) (routine) without abnormal findings: Secondary | ICD-10-CM

## 2012-08-18 DIAGNOSIS — N951 Menopausal and female climacteric states: Secondary | ICD-10-CM

## 2012-08-18 NOTE — Progress Notes (Addendum)
52 y.o. G34P1 Married [place] F here for annual exam.  Son got married this past year.  She forgot to bring pictures for me to see.  She is contemplating stopping her breast cancer therapy.  Saw Dr. Myna Hidalgo who switched her from Tamoxifen to Arimidex.  She just doesn't feel like she wants to keep taking anything.  Wants my opinion.  I don't know the exact numbers of decreasing her recurrence risk by continuing therapy for longer.  Advised I will try to communicate with Dr. Myna Hidalgo so I can help her make this decision.  Questions need for MRI this year.  Reviewed Dr. Gustavo Lah note who clearly says he feels she can stop these.  Advised 3D MMG this year.  Patient in agreement.  She wants to know if she is truly menopausal.  Reviewed Pap guidelines.  She is very uncomfortable with skipping Pap smears.  Also, noting some mild bloating.  Has concerns about ovarian cancer.  Would like ultrasound.  Patient's last menstrual period was 01/11/2011.          Sexually active: yes  The current method of family planning is post menopausal status.    Exercising: yes  zumba and cardio Smoker:  no  Health Maintenance: Pap:  07/22/11 WNL/negative HR HPV MMG:  09/22/11 normal Colonoscopy:  09/18/08 repeat 10 years BMD:   11/10, updated 08/27/12 lowest t score -1.3 spine, FRAX hip 0.3%, major osteoporotic 5.4% TDaP:  Overdue--declines today Labs: with Dr. Posey Rea 1/14   reports that she has never smoked. She does not have any smokeless tobacco history on file. She reports that she does not drink alcohol or use illicit drugs.  Past Medical History  Diagnosis Date  . Painful respiration   . Nonspecific abnormal electrocardiogram (ECG) (EKG)   . Pain in limb   . Pain in joint, shoulder region   . Headache   . Migraine without aura, with intractable migraine, so stated, without mention of status migrainosus   . Cystitis, unspecified   . Lumbago   . Paroxysmal tachycardia, unspecified   . Irritable bowel syndrome    . Vomiting alone   . Flatulence, eructation, and gas pain   . Personal history of contact with and (suspected) exposure to potentially hazardous body fluids   . Personal history of malignant neoplasm of breast   . Rash and other nonspecific skin eruption   . Other specified disorder of rectum and anus   . Peptic ulcer, unspecified site, unspecified as acute or chronic, without mention of hemorrhage, perforation, or obstruction   . Diaphragmatic hernia without mention of obstruction or gangrene   . Allergic rhinitis, cause unspecified   . Generalized hyperhidrosis   . Malignant neoplasm of breast (female), unspecified site   . Dizziness and giddiness   . Abnormal weight gain   . Cervicalgia   . Esophageal reflux   . Unspecified asthma   . Depression     Past Surgical History  Procedure Laterality Date  . Breast lumpectomy    . Tumor removal      subcutaneous of right thigh  . Hysteroscopy w/d&c    . Teeth implant  7/09    2 teeth  . Left leg tumor removal  2002    benign  . Lipoma excision  2002    left shoulder    Current Outpatient Prescriptions  Medication Sig Dispense Refill  . albuterol (PROAIR HFA) 108 (90 BASE) MCG/ACT inhaler Inhale 2 puffs into the lungs every 6 (six)  hours as needed.        Marland Kitchen anastrozole (ARIMIDEX) 1 MG tablet Take 1 tablet (1 mg total) by mouth daily.  90 tablet  3  . Cholecalciferol (VITAMIN D PO) Take by mouth daily.      Marland Kitchen desvenlafaxine (PRISTIQ) 50 MG 24 hr tablet Take 1 tablet (50 mg total) by mouth daily.  90 tablet  3  . diclofenac sodium (VOLTAREN) 1 % GEL Apply 4 g topically 4 (four) times daily.  200 g  3  . EPINEPHrine (EPIPEN 2-PAK) 0.3 mg/0.3 mL DEVI Inject 0.3 mg into the muscle once.        . Fluticasone-Salmeterol (ADVAIR DISKUS) 100-50 MCG/DOSE AEPB Inhale 1 puff into the lungs every 12 (twelve) hours.        Marland Kitchen olmesartan (BENICAR) 40 MG tablet Take 1 tablet (40 mg total) by mouth daily.  90 tablet  3  . omeprazole-sodium  bicarbonate (ZEGERID) 40-1100 MG per capsule TAKE ONE CAPSULE BY MOUTH EVERY DAY  90 capsule  2  . Vitamin D, Ergocalciferol, (DRISDOL) 50000 UNITS CAPS Take 1 capsule (50,000 Units total) by mouth every 30 (thirty) days.  9 capsule  3  . Mometasone Furo-Formoterol Fum (DULERA) 200-5 MCG/ACT AERO Inhale 2 puffs into the lungs 2 (two) times daily.  1 Inhaler  5   Current Facility-Administered Medications  Medication Dose Route Frequency Provider Last Rate Last Dose  . methylPREDNISolone acetate (DEPO-MEDROL) injection 80 mg  80 mg Intra-articular Once Tresa Garter, MD        Family History  Problem Relation Age of Onset  . Colon cancer Neg Hx   . Hypertension Other   . COPD Mother   . Diabetes Mother   . Heart disease Mother   . Hypertension Mother     ROS:  Pertinent items are noted in HPI.  Otherwise, a comprehensive ROS was negative.  Exam:   BP 180/82  Ht 5' 5.75" (1.67 m)  Wt 178 lb 3.2 oz (80.831 kg)  BMI 28.98 kg/m2  LMP 01/11/2011  Height:   Height: 5' 5.75" (167 cm)  Ht Readings from Last 3 Encounters:  08/18/12 5' 5.75" (1.67 m)  06/04/12 5\' 6"  (1.676 m)  11/06/11 5\' 5"  (1.651 m)    General appearance: alert, cooperative and appears stated age Head: Normocephalic, without obvious abnormality, atraumatic Neck: no adenopathy, supple, symmetrical, trachea midline and thyroid NE with no nodules or masses Lungs: clear to auscultation bilaterally Breasts: Scarring from prior surgery, stable, no masses or LAD Heart: regular rate and rhythm Abdomen: soft, non-tender; bowel sounds normal; no masses,  no organomegaly Extremities: extremities normal, atraumatic, no cyanosis or edema Skin: Skin color, texture, turgor normal. No rashes or lesions Lymph nodes: Cervical, supraclavicular, and axillary nodes normal. No abnormal inguinal nodes palpated Neurologic: Grossly normal   Pelvic: External genitalia:  no lesions              Urethra:  normal appearing urethra  with no masses, tenderness or lesions              Bartholins and Skenes: normal                 Vagina: normal appearing vagina with normal color and discharge, no lesions              Cervix: parous, no lesions              Pap taken: yes Bimanual Exam:  Uterus:  Midline, no  masses, normal size, mobile              Adnexa: no masses or fullness               Rectovaginal: Confirms               Anus:  normal sphincter tone, no lesions  A:  Well Woman with normal exam 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  She and I discussed MRIs and I reviewed Dr. Joycelyn Das note with her. Vit D today. FSH She will call Dr Posey Rea to get advice about BP meds Plan pelvic U/S Recheck 1 year or f/u prn  An After Visit Summary was printed and given to the patient.

## 2012-08-19 ENCOUNTER — Telehealth: Payer: Self-pay | Admitting: Internal Medicine

## 2012-08-19 NOTE — Telephone Encounter (Signed)
Called pt and LV for pt to call back.

## 2012-08-19 NOTE — Telephone Encounter (Signed)
Will have schedulers contact [facility] to arrange OV for her friend.

## 2012-08-19 NOTE — Telephone Encounter (Signed)
Pt called stated she has a friend, Crissie Reese, that just move here from [place]. Pt was wondering if you would take her friend on as a new pt. Please advise? Pt is aware that you are not taking new pt at this time.

## 2012-08-19 NOTE — Telephone Encounter (Signed)
Ok Thx 

## 2012-08-20 LAB — FOLLICLE STIMULATING HORMONE: FSH: 40.2 m[IU]/mL

## 2012-08-20 NOTE — Patient Instructions (Signed)

## 2012-08-21 LAB — VITAMIN D 25 HYDROXY (VIT D DEFICIENCY, FRACTURES): Vit D, 25-Hydroxy: 32 ng/mL (ref 30–89)

## 2012-08-23 ENCOUNTER — Telehealth: Payer: Self-pay | Admitting: *Deleted

## 2012-08-23 ENCOUNTER — Ambulatory Visit (INDEPENDENT_AMBULATORY_CARE_PROVIDER_SITE_OTHER): Payer: PRIVATE HEALTH INSURANCE | Admitting: Internal Medicine

## 2012-08-23 ENCOUNTER — Encounter: Payer: Self-pay | Admitting: Internal Medicine

## 2012-08-23 VITALS — BP 148/80 | HR 76 | Temp 98.3°F | Resp 16 | Wt 176.0 lb

## 2012-08-23 DIAGNOSIS — Z853 Personal history of malignant neoplasm of breast: Secondary | ICD-10-CM

## 2012-08-23 DIAGNOSIS — I10 Essential (primary) hypertension: Secondary | ICD-10-CM

## 2012-08-23 DIAGNOSIS — K219 Gastro-esophageal reflux disease without esophagitis: Secondary | ICD-10-CM

## 2012-08-23 DIAGNOSIS — R61 Generalized hyperhidrosis: Secondary | ICD-10-CM

## 2012-08-23 LAB — IPS PAP SMEAR ONLY

## 2012-08-23 MED ORDER — OMEPRAZOLE-SODIUM BICARBONATE 40-1100 MG PO CAPS: 1.0000 | ORAL_CAPSULE | Freq: Every day | ORAL | Status: DC

## 2012-08-23 MED ORDER — CANDESARTAN CILEXETIL 32 MG PO TABS: 32.0000 mg | ORAL_TABLET | Freq: Every day | ORAL | Status: DC

## 2012-08-23 NOTE — Assessment & Plan Note (Signed)
  Will switch to Atacand generic

## 2012-08-23 NOTE — Assessment & Plan Note (Signed)
Remote On Arimidex >5 years

## 2012-08-23 NOTE — Telephone Encounter (Signed)
Message copied by Alisa Graff on Mon Aug 23, 2012  6:20 PM ------      Message from: Jerene Bears      Created: Mon Aug 23, 2012  5:37 PM       02 recall ------

## 2012-08-23 NOTE — Telephone Encounter (Signed)
[email or web address] 

## 2012-08-23 NOTE — Assessment & Plan Note (Signed)
Continue with current prn prescription therapy as reflected on the Med list.  

## 2012-08-23 NOTE — Progress Notes (Signed)
   Subjective:     HPI    The patient needs to address  chronic hypertension that has been not controlled with medicines; to address chronic  hyperlipidemia controlled with medicines as well; and to address type 2 chronic pre-diabetes, controlled with medical treatment and diet.  C/o meds are too $$$ w/new insurance  BP Readings from Last 3 Encounters:  08/23/12 148/80  08/18/12 180/82  07/02/12 122/80   Wt Readings from Last 3 Encounters:  08/23/12 176 lb (79.833 kg)  08/18/12 178 lb 3.2 oz (80.831 kg)  06/04/12 180 lb 8 oz (81.874 kg)      Review of Systems  Constitutional: Negative for chills, activity change, appetite change, fatigue and unexpected weight change.  HENT: Negative for congestion, mouth sores, postnasal drip and sinus pressure.   Eyes: Negative for visual disturbance.  Respiratory: Negative for cough and chest tightness.   Gastrointestinal: Negative for nausea and abdominal pain.  Genitourinary: Negative for frequency, difficulty urinating and vaginal pain.  Musculoskeletal: Negative for back pain and gait problem.  Skin: Negative for pallor and rash.  Neurological: Negative for dizziness, tremors, weakness and numbness.  Psychiatric/Behavioral: Negative for suicidal ideas, hallucinations, confusion, sleep disturbance and dysphoric mood. The patient is not nervous/anxious.        Objective:   Physical Exam  Constitutional: She appears well-developed. No distress.  Obese  HENT:  Head: Normocephalic.  Right Ear: External ear normal.  Left Ear: External ear normal.  Nose: Nose normal.  Mouth/Throat: Oropharynx is clear and moist.  Eyes: Conjunctivae are normal. Pupils are equal, round, and reactive to light. Right eye exhibits no discharge. Left eye exhibits no discharge.  Neck: Normal range of motion. Neck supple. No JVD present. No tracheal deviation present. No thyromegaly present.  Cardiovascular: Normal rate, regular rhythm and normal heart  sounds.   Pulmonary/Chest: No stridor. No respiratory distress. She has no wheezes.  Abdominal: Soft. Bowel sounds are normal. She exhibits no distension and no mass. There is no tenderness. There is no rebound and no guarding.  Musculoskeletal: She exhibits no edema and no tenderness.  Lymphadenopathy:    She has no cervical adenopathy.  Neurological: She displays normal reflexes. No cranial nerve deficit. She exhibits normal muscle tone. Coordination normal.  Skin: No rash noted. No erythema.  Psychiatric: She has a normal mood and affect. Her behavior is normal. Judgment and thought content normal.     Lab Results  Component Value Date   WBC 4.0* 06/04/2012   HGB 12.9 06/04/2012   HCT 37.9 06/04/2012   PLT 214.0 06/04/2012   GLUCOSE 94 06/04/2012   CHOL 251* 06/04/2012   TRIG 114.0 06/04/2012   HDL 52.80 06/04/2012   LDLDIRECT 157.3 06/04/2012   ALT 23 06/04/2012   AST 24 06/04/2012   NA 142 06/04/2012   K 4.4 06/04/2012   CL 105 06/04/2012   CREATININE 0.6 06/04/2012   BUN 16 06/04/2012   CO2 30 06/04/2012   TSH 2.15 06/04/2012   INR 0.9 ratio 09/05/2009        Assessment & Plan:

## 2012-08-24 NOTE — Assessment & Plan Note (Signed)
Hormonal/arimidex related  Shauniece is thinking of stoping Arimidex in the near future

## 2012-08-26 ENCOUNTER — Telehealth: Payer: Self-pay | Admitting: Obstetrics & Gynecology

## 2012-08-26 ENCOUNTER — Telehealth: Payer: Self-pay | Admitting: Internal Medicine

## 2012-08-26 NOTE — Telephone Encounter (Signed)
Patient calling to confirm appointment time with dr Hyacinth Meeker for ultrasound will work for her, PUS Tues, 08-31-12 at 330.

## 2012-08-26 NOTE — Telephone Encounter (Signed)
I called pharmacy- Patient's Atacand does not need PA. Her Zegerid does. I informed pt the Atacand Rx is ready for pick up. Pt states the pharmacy had her confused and she will pick up Atacand Rx today.

## 2012-08-26 NOTE — Telephone Encounter (Signed)
Pt called req sample of Benicar or something to help with her BP. Pt stated that Omeprazole is waiting to be authorize and her BP is 168/106 today. Please advise.

## 2012-08-30 ENCOUNTER — Encounter: Payer: Self-pay | Admitting: *Deleted

## 2012-08-31 ENCOUNTER — Other Ambulatory Visit: Payer: Self-pay

## 2012-08-31 ENCOUNTER — Institutional Professional Consult (permissible substitution): Payer: Self-pay | Admitting: Obstetrics & Gynecology

## 2012-09-01 NOTE — Telephone Encounter (Signed)
No msg °

## 2012-09-14 ENCOUNTER — Telehealth: Payer: Self-pay | Admitting: *Deleted

## 2012-09-14 NOTE — Telephone Encounter (Signed)
Fine.  Thank you.

## 2012-09-14 NOTE — Telephone Encounter (Signed)
Patient called to reschedule PUS from April.  States she can not get anyone to work for her until June so requests late PM appt In June.  Scheduled for 10-11-12 at 4pm.

## 2012-09-20 ENCOUNTER — Encounter: Payer: Self-pay | Admitting: Internal Medicine

## 2012-09-20 ENCOUNTER — Ambulatory Visit (INDEPENDENT_AMBULATORY_CARE_PROVIDER_SITE_OTHER): Payer: PRIVATE HEALTH INSURANCE | Admitting: Internal Medicine

## 2012-09-20 VITALS — BP 118/90 | HR 89 | Temp 97.3°F | Resp 16 | Wt 177.0 lb

## 2012-09-20 DIAGNOSIS — J45909 Unspecified asthma, uncomplicated: Secondary | ICD-10-CM

## 2012-09-20 DIAGNOSIS — R059 Cough, unspecified: Secondary | ICD-10-CM

## 2012-09-20 DIAGNOSIS — R05 Cough: Secondary | ICD-10-CM

## 2012-09-20 DIAGNOSIS — J209 Acute bronchitis, unspecified: Secondary | ICD-10-CM

## 2012-09-20 DIAGNOSIS — J309 Allergic rhinitis, unspecified: Secondary | ICD-10-CM

## 2012-09-20 MED ORDER — METHYLPREDNISOLONE ACETATE 80 MG/ML IJ SUSP
80.0000 mg | Freq: Once | INTRAMUSCULAR | Status: AC
  Administered 2012-09-20: 80 mg via INTRAMUSCULAR

## 2012-09-20 MED ORDER — AZITHROMYCIN 250 MG PO TABS: ORAL_TABLET | ORAL | Status: DC

## 2012-09-20 MED ORDER — PROMETHAZINE-CODEINE 6.25-10 MG/5ML PO SYRP
5.0000 mL | ORAL_SOLUTION | ORAL | Status: DC | PRN
Start: 2012-09-20 — End: 2013-04-27

## 2012-09-20 MED ORDER — PREDNISONE 10 MG PO TABS: ORAL_TABLET | ORAL | Status: DC

## 2012-09-20 NOTE — Telephone Encounter (Signed)
Patient states she is returning Sarah Waller's call but no call from [place] logged. CPT code requested for procedure as well please.

## 2012-09-20 NOTE — Progress Notes (Signed)
   Subjective:     Shortness of Breath This is a new problem. The current episode started in the past 7 days. Associated symptoms include wheezing. Pertinent negatives include no abdominal pain or rash.  Wheezing  Associated symptoms include shortness of breath. Pertinent negatives include no abdominal pain, chills, coughing or rash.      The patient needs to address  chronic hypertension that has been not controlled with medicines; to address chronic  hyperlipidemia controlled with medicines as well; and to address type 2 chronic pre-diabetes, controlled with medical treatment and diet.  C/o meds are too $$$ w/new insurance  BP Readings from Last 3 Encounters:  09/20/12 118/90  08/23/12 148/80  08/18/12 180/82   Wt Readings from Last 3 Encounters:  09/20/12 177 lb (80.287 kg)  08/23/12 176 lb (79.833 kg)  08/18/12 178 lb 3.2 oz (80.831 kg)      Review of Systems  Constitutional: Negative for chills, activity change, appetite change, fatigue and unexpected weight change.  HENT: Negative for congestion, mouth sores, postnasal drip and sinus pressure.   Eyes: Negative for visual disturbance.  Respiratory: Positive for shortness of breath and wheezing. Negative for cough and chest tightness.   Gastrointestinal: Negative for nausea and abdominal pain.  Genitourinary: Negative for frequency, difficulty urinating and vaginal pain.  Musculoskeletal: Negative for back pain and gait problem.  Skin: Negative for pallor and rash.  Neurological: Negative for dizziness, tremors, weakness and numbness.  Psychiatric/Behavioral: Negative for suicidal ideas, hallucinations, confusion, sleep disturbance and dysphoric mood. The patient is not nervous/anxious.        Objective:   Physical Exam  Constitutional: She appears well-developed. No distress.  Obese  HENT:  Head: Normocephalic.  Right Ear: External ear normal.  Left Ear: External ear normal.  Nose: Nose normal.  Mouth/Throat:  Oropharynx is clear and moist.  Eyes: Conjunctivae are normal. Pupils are equal, round, and reactive to light. Right eye exhibits no discharge. Left eye exhibits no discharge.  Neck: Normal range of motion. Neck supple. No JVD present. No tracheal deviation present. No thyromegaly present.  Cardiovascular: Normal rate, regular rhythm and normal heart sounds.   Pulmonary/Chest: No stridor. No respiratory distress. She has no wheezes.  Abdominal: Soft. Bowel sounds are normal. She exhibits no distension and no mass. There is no tenderness. There is no rebound and no guarding.  Musculoskeletal: She exhibits no edema and no tenderness.  Lymphadenopathy:    She has no cervical adenopathy.  Neurological: She displays normal reflexes. No cranial nerve deficit. She exhibits normal muscle tone. Coordination normal.  Skin: No rash noted. No erythema.  Psychiatric: She has a normal mood and affect. Her behavior is normal. Judgment and thought content normal.     Lab Results  Component Value Date   WBC 4.0* 06/04/2012   HGB 12.9 06/04/2012   HCT 37.9 06/04/2012   PLT 214.0 06/04/2012   GLUCOSE 94 06/04/2012   CHOL 251* 06/04/2012   TRIG 114.0 06/04/2012   HDL 52.80 06/04/2012   LDLDIRECT 157.3 06/04/2012   ALT 23 06/04/2012   AST 24 06/04/2012   NA 142 06/04/2012   K 4.4 06/04/2012   CL 105 06/04/2012   CREATININE 0.6 06/04/2012   BUN 16 06/04/2012   CO2 30 06/04/2012   TSH 2.15 06/04/2012   INR 0.9 ratio 09/05/2009        Assessment & Plan:

## 2012-09-20 NOTE — Patient Instructions (Addendum)
Use over-the-counter  "cold" medicines  such as "Tylenol cold" , "Advil cold",  "Mucinex" or" Mucinex D"  for cough and congestion.   Avoid decongestants if you have high blood pressure and use "Afrin" nasal spray for nasal congestion as directed instead. Use" Delsym" or" Robitussin" cough syrup varietis for cough.  You can use plain "Tylenol" or "Advil" for fever, chills and achyness.  Please, make an appointment if you are not better or if you're worse.  

## 2012-09-21 ENCOUNTER — Telehealth: Payer: Self-pay | Admitting: Obstetrics & Gynecology

## 2012-09-21 ENCOUNTER — Encounter: Payer: Self-pay | Admitting: Internal Medicine

## 2012-09-21 DIAGNOSIS — J209 Acute bronchitis, unspecified: Secondary | ICD-10-CM | POA: Insufficient documentation

## 2012-09-21 NOTE — Assessment & Plan Note (Signed)
asthmatic exacerbation Prom-cod prn

## 2012-09-21 NOTE — Telephone Encounter (Signed)
Spoke with pt advised insurance benefits and explained that we received two different quotes from [facility] for this procedure. Patient agreed to pay $40 copay and if the claim comes back that she has to pay anything else she agreed to pay it. Patient is already scheduled for 10/11/12.

## 2012-09-21 NOTE — Assessment & Plan Note (Signed)
Continue with current prescription therapy as reflected on the Med list.  

## 2012-09-21 NOTE — Assessment & Plan Note (Signed)
Zpac 

## 2012-09-21 NOTE — Assessment & Plan Note (Signed)
Depomedrol 80 mg im MDI

## 2012-10-08 ENCOUNTER — Ambulatory Visit (HOSPITAL_BASED_OUTPATIENT_CLINIC_OR_DEPARTMENT_OTHER): Payer: PRIVATE HEALTH INSURANCE | Admitting: Hematology & Oncology

## 2012-10-08 ENCOUNTER — Telehealth: Payer: Self-pay | Admitting: Hematology & Oncology

## 2012-10-08 ENCOUNTER — Ambulatory Visit (HOSPITAL_BASED_OUTPATIENT_CLINIC_OR_DEPARTMENT_OTHER): Payer: PRIVATE HEALTH INSURANCE | Admitting: Lab

## 2012-10-08 DIAGNOSIS — C50919 Malignant neoplasm of unspecified site of unspecified female breast: Secondary | ICD-10-CM

## 2012-10-08 DIAGNOSIS — M810 Age-related osteoporosis without current pathological fracture: Secondary | ICD-10-CM

## 2012-10-08 DIAGNOSIS — C50519 Malignant neoplasm of lower-outer quadrant of unspecified female breast: Secondary | ICD-10-CM

## 2012-10-08 DIAGNOSIS — N959 Unspecified menopausal and perimenopausal disorder: Secondary | ICD-10-CM

## 2012-10-08 DIAGNOSIS — J4531 Mild persistent asthma with (acute) exacerbation: Secondary | ICD-10-CM

## 2012-10-08 DIAGNOSIS — J45909 Unspecified asthma, uncomplicated: Secondary | ICD-10-CM

## 2012-10-08 LAB — CBC WITH DIFFERENTIAL (CANCER CENTER ONLY)
BASO#: 0 10*3/uL (ref 0.0–0.2)
Eosinophils Absolute: 0.1 10*3/uL (ref 0.0–0.5)
HGB: 12 g/dL (ref 11.6–15.9)
MCH: 29.8 pg (ref 26.0–34.0)
MONO#: 0.4 10*3/uL (ref 0.1–0.9)
MONO%: 8 % (ref 0.0–13.0)
NEUT#: 3.4 10*3/uL (ref 1.5–6.5)
RBC: 4.03 10*6/uL (ref 3.70–5.32)
WBC: 4.9 10*3/uL (ref 3.9–10.0)

## 2012-10-08 MED ORDER — MEGESTROL ACETATE 20 MG PO TABS: 20.0000 mg | ORAL_TABLET | Freq: Every day | ORAL | Status: DC

## 2012-10-08 MED ORDER — MONTELUKAST SODIUM 10 MG PO TABS: 10.0000 mg | ORAL_TABLET | Freq: Every day | ORAL | Status: DC

## 2012-10-08 MED ORDER — EXEMESTANE 25 MG PO TABS: 25.0000 mg | ORAL_TABLET | Freq: Every day | ORAL | Status: DC

## 2012-10-08 NOTE — Telephone Encounter (Signed)
Pt left after appointment and told Tresa Endo she would call to schedule next appointment. I scheduled one for a year out but Pt is not aware of appointment at this time.

## 2012-10-08 NOTE — Progress Notes (Signed)
This office note has been dictated.

## 2012-10-09 LAB — COMPREHENSIVE METABOLIC PANEL
Albumin: 4.7 g/dL (ref 3.5–5.2)
Alkaline Phosphatase: 70 U/L (ref 39–117)
BUN: 16 mg/dL (ref 6–23)
CO2: 26 mEq/L (ref 19–32)
Calcium: 9.6 mg/dL (ref 8.4–10.5)
Chloride: 106 mEq/L (ref 96–112)
Glucose, Bld: 95 mg/dL (ref 70–99)
Potassium: 4.2 mEq/L (ref 3.5–5.3)
Total Protein: 6.7 g/dL (ref 6.0–8.3)

## 2012-10-09 LAB — VITAMIN D 25 HYDROXY (VIT D DEFICIENCY, FRACTURES): Vit D, 25-Hydroxy: 25 ng/mL — ABNORMAL LOW (ref 30–89)

## 2012-10-11 ENCOUNTER — Ambulatory Visit (INDEPENDENT_AMBULATORY_CARE_PROVIDER_SITE_OTHER): Payer: No Typology Code available for payment source | Admitting: Obstetrics & Gynecology

## 2012-10-11 ENCOUNTER — Encounter: Payer: Self-pay | Admitting: *Deleted

## 2012-10-11 ENCOUNTER — Ambulatory Visit (INDEPENDENT_AMBULATORY_CARE_PROVIDER_SITE_OTHER): Payer: No Typology Code available for payment source

## 2012-10-11 DIAGNOSIS — R140 Abdominal distension (gaseous): Secondary | ICD-10-CM

## 2012-10-11 DIAGNOSIS — Z853 Personal history of malignant neoplasm of breast: Secondary | ICD-10-CM

## 2012-10-11 DIAGNOSIS — N83339 Acquired atrophy of ovary and fallopian tube, unspecified side: Secondary | ICD-10-CM

## 2012-10-11 DIAGNOSIS — Z09 Encounter for follow-up examination after completed treatment for conditions other than malignant neoplasm: Secondary | ICD-10-CM

## 2012-10-11 DIAGNOSIS — D251 Intramural leiomyoma of uterus: Secondary | ICD-10-CM

## 2012-10-11 DIAGNOSIS — R141 Gas pain: Secondary | ICD-10-CM

## 2012-10-11 MED ORDER — FLUCONAZOLE 150 MG PO TABS: 150.0000 mg | ORAL_TABLET | Freq: Once | ORAL | Status: DC

## 2012-10-11 NOTE — Progress Notes (Signed)
52 y.o.Marriedfemale here for a pelvic ultrasound.  She has a history of uterine fibroids and breast cancer.  She is anxious about her ovaries and the history of fibroids.  Has had no bleeding or pelvic pain.  Desires ultrasound for evaluation.  Brought book of pictures from her son's wedding that she wanted to show me.  Patient's last menstrual period was 01/11/2011.  Sexually active:  yes  Contraception: post menopausal status  FINDINGS: (report at end of note) UTERUS: 9.7 x 5.5 x 4.6cm with 2.5cm and 1.7cm fibroids EMS: 3.6mm ADNEXA:   Left ovary 2.8 x 2.0 x 1.7cm   Right ovary 2.4 x 1.6 x 1.1cm CUL DE SAC: net  Images reviewed personally with pt and compared to prior ultrasound.  Fibroids are stable from prior exam.  Assessment:  Uterine fibroids in PMP female not on HRT due to h/o breast cancer Plan: Pt very reassured.  Will follow-up for AEX or earlier with new issues.  ~15 minutes spent with patient >50% of time was in face to face discussion of above.

## 2012-10-11 NOTE — Progress Notes (Signed)
CC:   Sarah Quint. Plotnikov, MD M. Leda Quail, MD Currie Paris, M.D.  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  CURRENT THERAPY:  The patient to have Arimidex changed to Aromasin 25 mg p.o. daily.  INTERIM HISTORY:  Sarah Waller comes in for followup.  Unfortunately, she is having a hard time with the Arimidex with hot flashes.  She is on Pristiq which she says helps but she is saying that this costs her quite a bit of money.  I switched her over from tamoxifen to Arimidex.  I wanted to have her on the Arimidex for 3 years.  She says the Arimidex is just causing too much in the way of hot flashes for her.  We will see about getting her on Aromasin.  She is doing okay otherwise.  She wants to be very proactive with respect to catching any other issues with respect to breast cancer.  I think she is due for a mammogram in about a month or so.  She did have a bone density test done.  This was done back in April. The bone density test showed that there was no change in the lumbar spine or left hip.  She does have some element of I think osteopenia.  She is on vitamin D.  She has had no fevers.  She has had no bleeding.  There has been no change in bowel or bladder habits.  She apparently had an asthma flare-up a week or so ago.  This seemed to get better initially and now it has gotten worse again on her.  PHYSICAL EXAMINATION:  General:  This is a well-developed, well- nourished [place] female in no obvious distress.  Vital signs:  Show a temperature of 97.9, pulse 76, respiratory rate 18, blood pressure 138/81.  Weight is 175.  Head and neck:  Shows a normocephalic, atraumatic skull.  There are no ocular or oral lesions.  There are no palpable cervical or supraclavicular lymph nodes.  Lungs:  Clear bilaterally.  Cardiac:  Regular rate and rhythm with a normal S1 and S2. There are no murmurs, rubs or bruits.  Abdomen:  Soft with good  bowel sounds.  There is no palpable abdominal mass.  There is no fluid wave. There is no palpable hepatosplenomegaly.  Breasts:  Show right breast with no masses, edema or erythema.  There is no right axillary adenopathy.  Left breast is slightly contracted from radiation.  She has well-healed lumpectomy at the 4 o'clock position.  She has no tenderness to palpation over the left breast.  There is no distinct mass in the left breast.  There is no left axillary adenopathy.  Back:  Exam shows no tenderness over the spine, ribs or hips.  She has no kyphosis or osteoporotic changes.  Extremities:  Show no clubbing, cyanosis or edema.  There may be some slight nonpitting edema of the left arm.  LABORATORY STUDIES:  White cell count 4.9, hemoglobin 12, hematocrit 37.3, platelet count 211.  IMPRESSION:  Sarah Waller is a 52 year old [place] female.  She has history of stage I ductal carcinoma of the left breast.  She was treated with tamoxifen.  We have her on an aromatase inhibitor now.  Again, we are going to have to switch the aromatase inhibitor from Arimidex to Aromasin.  Maybe she will tolerate this better.  I told her that I really would recommend 3 years of the aromatase inhibitor to thoroughly give her  adequate hormonal therapy.  I am going to put her on some Megace to see if this does not help with the hot flashes.  This might be a little bit better for her than the Pristiq with respect to effectiveness and cost.  I do not see any obvious contraindication to her having low-dose Megace.  We will get her back in 1 year.  We certainly can get her back sooner if we need to.    ______________________________ Josph Macho, M.D. PRE/MEDQ  D:  10/08/2012  T:  10/09/2012  Job:  [id number]

## 2012-10-11 NOTE — Patient Instructions (Addendum)
Fibroids Fibroids are lumps (tumors) that can occur any place in a woman's body. These lumps are not cancerous. Fibroids vary in size, weight, and where they grow. HOME CARE  Do not take aspirin.  Write down the number of pads or tampons you use during your period. Tell your doctor. This can help determine the best treatment for you. GET HELP RIGHT AWAY IF:  You have pain in your lower belly (abdomen) that is not helped with medicine.  You have cramps that are not helped with medicine.  You have more bleeding between or during your period.  You feel lightheaded or pass out (faint).  Your lower belly pain gets worse. MAKE SURE YOU:  Understand these instructions.  Will watch your condition.  Will get help right away if you are not doing well or get worse. Document Released: 05/31/2010 Document Revised: 07/21/2011 Document Reviewed: 05/31/2010 [employer] Patient Information 2014 [employer].  

## 2012-10-13 ENCOUNTER — Encounter: Payer: Self-pay | Admitting: Obstetrics & Gynecology

## 2012-10-25 ENCOUNTER — Telehealth: Payer: Self-pay | Admitting: Hematology & Oncology

## 2012-10-25 NOTE — Telephone Encounter (Addendum)
Message copied by Cathi Roan on Mon Oct 25, 2012  3:06 PM ------      Message from: Josph Macho      Created: Sun Oct 10, 2012  8:30 PM       Call - vit D is low. How much is she taking??  Cindee Lame  10-25-12 Called patient and left message on her phone regarding above MD message. Advised patient to call this office and let us know the strength of the Vit. D she is taking and we will advise her on possible increase dose.. S. Trynosky LPN ------

## 2012-11-04 ENCOUNTER — Ambulatory Visit: Payer: BC Managed Care – PPO | Admitting: Hematology & Oncology

## 2013-02-04 ENCOUNTER — Ambulatory Visit (INDEPENDENT_AMBULATORY_CARE_PROVIDER_SITE_OTHER): Payer: PRIVATE HEALTH INSURANCE | Admitting: Internal Medicine

## 2013-02-04 VITALS — BP 116/98 | HR 68 | Temp 98.8°F | Wt 177.0 lb

## 2013-02-04 DIAGNOSIS — I10 Essential (primary) hypertension: Secondary | ICD-10-CM

## 2013-02-04 DIAGNOSIS — R51 Headache: Secondary | ICD-10-CM

## 2013-02-04 DIAGNOSIS — G43019 Migraine without aura, intractable, without status migrainosus: Secondary | ICD-10-CM

## 2013-02-04 DIAGNOSIS — R110 Nausea: Secondary | ICD-10-CM

## 2013-02-04 MED ORDER — ONDANSETRON HCL 4 MG/2ML IJ SOLN
4.0000 mg | Freq: Once | INTRAMUSCULAR | Status: AC
  Administered 2013-02-04: 4 mg via INTRAMUSCULAR

## 2013-02-04 MED ORDER — KETOROLAC TROMETHAMINE 60 MG/2ML IM SOLN
60.0000 mg | Freq: Once | INTRAMUSCULAR | Status: AC
  Administered 2013-02-04: 60 mg via INTRAMUSCULAR

## 2013-02-04 MED ORDER — ONDANSETRON 4 MG PO TBDP: 4.0000 mg | ORAL_TABLET | Freq: Three times a day (TID) | ORAL | Status: DC | PRN

## 2013-02-04 MED ORDER — ZOLMITRIPTAN 5 MG NA SOLN: 1.0000 | NASAL | Status: DC | PRN

## 2013-02-04 MED ORDER — OLMESARTAN MEDOXOMIL 40 MG PO TABS: 40.0000 mg | ORAL_TABLET | Freq: Every day | ORAL | Status: DC

## 2013-02-04 NOTE — Progress Notes (Signed)
   Subjective:   C/o n/v and HA today   Hypertension Associated symptoms include shortness of breath.  Shortness of Breath This is a new problem. The current episode started in the past 7 days. Associated symptoms include wheezing. Pertinent negatives include no abdominal pain or rash.  Wheezing  Associated symptoms include shortness of breath. Pertinent negatives include no abdominal pain, chills, coughing or rash.      The patient needs to address  chronic hypertension that has been not controlled with medicines; to address chronic  hyperlipidemia controlled with medicines as well; and to address type 2 chronic pre-diabetes, controlled with medical treatment and diet.  C/o meds are too $$$ w/new insurance  BP Readings from Last 3 Encounters:  02/04/13 116/98  10/08/12 138/81  09/20/12 118/90   Wt Readings from Last 3 Encounters:  02/04/13 177 lb (80.287 kg)  10/08/12 175 lb (79.379 kg)  09/20/12 177 lb (80.287 kg)      Review of Systems  Constitutional: Negative for chills, activity change, appetite change, fatigue and unexpected weight change.  HENT: Negative for congestion, mouth sores, postnasal drip and sinus pressure.   Eyes: Negative for visual disturbance.  Respiratory: Positive for shortness of breath and wheezing. Negative for cough and chest tightness.   Gastrointestinal: Negative for nausea and abdominal pain.  Genitourinary: Negative for frequency, difficulty urinating and vaginal pain.  Musculoskeletal: Negative for back pain and gait problem.  Skin: Negative for pallor and rash.  Neurological: Negative for dizziness, tremors, weakness and numbness.  Psychiatric/Behavioral: Negative for suicidal ideas, hallucinations, confusion, sleep disturbance and dysphoric mood. The patient is not nervous/anxious.        Objective:   Physical Exam  Constitutional: She appears well-developed. No distress.  Obese  HENT:  Head: Normocephalic.  Right Ear: External  ear normal.  Left Ear: External ear normal.  Nose: Nose normal.  Mouth/Throat: Oropharynx is clear and moist.  Eyes: Conjunctivae are normal. Pupils are equal, round, and reactive to light. Right eye exhibits no discharge. Left eye exhibits no discharge.  Neck: Normal range of motion. Neck supple. No JVD present. No tracheal deviation present. No thyromegaly present.  Cardiovascular: Normal rate, regular rhythm and normal heart sounds.   Pulmonary/Chest: No stridor. No respiratory distress. She has no wheezes.  Abdominal: Soft. Bowel sounds are normal. She exhibits no distension and no mass. There is no tenderness. There is no rebound and no guarding.  Musculoskeletal: She exhibits no edema and no tenderness.  Lymphadenopathy:    She has no cervical adenopathy.  Neurological: She displays normal reflexes. No cranial nerve deficit. She exhibits normal muscle tone. Coordination normal.  Skin: No rash noted. No erythema.  Psychiatric: She has a normal mood and affect. Her behavior is normal. Judgment and thought content normal.     Lab Results  Component Value Date   WBC 4.9 10/08/2012   HGB 12.0 10/08/2012   HCT 37.3 10/08/2012   PLT 211 10/08/2012   GLUCOSE 95 10/08/2012   CHOL 251* 06/04/2012   TRIG 114.0 06/04/2012   HDL 52.80 06/04/2012   LDLDIRECT 157.3 06/04/2012   ALT 19 10/08/2012   AST 19 10/08/2012   NA 141 10/08/2012   K 4.2 10/08/2012   CL 106 10/08/2012   CREATININE 0.61 10/08/2012   BUN 16 10/08/2012   CO2 26 10/08/2012   TSH 2.15 06/04/2012   INR 0.9 ratio 09/05/2009        Assessment & Plan:

## 2013-02-04 NOTE — Assessment & Plan Note (Signed)
Recurrent  

## 2013-02-05 ENCOUNTER — Encounter: Payer: Self-pay | Admitting: Internal Medicine

## 2013-02-05 NOTE — Assessment & Plan Note (Signed)
Migraine Zofran and Toradol IM

## 2013-02-05 NOTE — Assessment & Plan Note (Signed)
Atacand gave side effect Re-start Benicar 40 mg/d

## 2013-02-05 NOTE — Assessment & Plan Note (Signed)
Toradol and Zofran IM

## 2013-03-08 ENCOUNTER — Telehealth: Payer: Self-pay

## 2013-03-08 NOTE — Telephone Encounter (Signed)
Patent called in asking about Benicar PA. Patient was called back and said that she was running low on prescription and was about out. Instructed patient that she could come and pick up samples until the PA process is finished.

## 2013-03-10 ENCOUNTER — Other Ambulatory Visit: Payer: Self-pay | Admitting: Hematology & Oncology

## 2013-03-17 ENCOUNTER — Ambulatory Visit (INDEPENDENT_AMBULATORY_CARE_PROVIDER_SITE_OTHER): Payer: PRIVATE HEALTH INSURANCE | Admitting: Internal Medicine

## 2013-03-17 ENCOUNTER — Other Ambulatory Visit (INDEPENDENT_AMBULATORY_CARE_PROVIDER_SITE_OTHER): Payer: PRIVATE HEALTH INSURANCE

## 2013-03-17 ENCOUNTER — Encounter: Payer: Self-pay | Admitting: Internal Medicine

## 2013-03-17 ENCOUNTER — Other Ambulatory Visit: Payer: Self-pay

## 2013-03-17 VITALS — BP 160/102 | HR 72 | Temp 98.4°F | Resp 16 | Wt 179.0 lb

## 2013-03-17 DIAGNOSIS — I10 Essential (primary) hypertension: Secondary | ICD-10-CM

## 2013-03-17 DIAGNOSIS — R609 Edema, unspecified: Secondary | ICD-10-CM

## 2013-03-17 DIAGNOSIS — R51 Headache: Secondary | ICD-10-CM

## 2013-03-17 LAB — BASIC METABOLIC PANEL
BUN: 15 mg/dL (ref 6–23)
Calcium: 9.6 mg/dL (ref 8.4–10.5)
Chloride: 104 mEq/L (ref 96–112)
Creatinine, Ser: 0.6 mg/dL (ref 0.4–1.2)
GFR: 119.91 mL/min (ref >60.00)
Glucose, Bld: 84 mg/dL (ref 70–99)
Sodium: 140 mEq/L (ref 135–145)

## 2013-03-17 LAB — URINALYSIS
Bilirubin Urine: NEGATIVE
Hgb urine dipstick: NEGATIVE
Ketones, ur: NEGATIVE
Leukocytes, UA: NEGATIVE
Total Protein, Urine: NEGATIVE
Urobilinogen, UA: 0.2 (ref 0.0–1.0)
pH: 7 (ref 5.0–8.0)

## 2013-03-17 LAB — TSH: TSH: 1.34 u[IU]/mL (ref 0.35–5.50)

## 2013-03-17 MED ORDER — OLMESARTAN MEDOXOMIL-HCTZ 40-25 MG PO TABS: 1.0000 | ORAL_TABLET | Freq: Every day | ORAL | Status: DC

## 2013-03-17 MED ORDER — LOSARTAN POTASSIUM-HCTZ 100-25 MG PO TABS: 1.0000 | ORAL_TABLET | Freq: Every day | ORAL | Status: DC

## 2013-03-17 MED ORDER — VENLAFAXINE HCL ER 37.5 MG PO CP24: 37.5000 mg | ORAL_CAPSULE | Freq: Every day | ORAL | Status: DC

## 2013-03-17 NOTE — Assessment & Plan Note (Signed)
Recurrent  

## 2013-03-17 NOTE — Progress Notes (Signed)
Subjective:   C/o fluid retention    Hypertension The problem has been waxing and waning since onset. Associated symptoms include shortness of breath.  Shortness of Breath This is a new problem. The current episode started in the past 7 days. Associated symptoms include wheezing. Pertinent negatives include no abdominal pain or rash.  Wheezing  Associated symptoms include shortness of breath. Pertinent negatives include no abdominal pain, chills, coughing or rash.      The patient needs to address  chronic hypertension that has been not controlled with medicines; to address chronic  hyperlipidemia controlled with medicines as well; and to address type 2 chronic pre-diabetes, controlled with medical treatment and diet.  C/o meds are too $$$ w/new insurance  BP Readings from Last 3 Encounters:  03/17/13 160/102  02/04/13 116/98  10/08/12 138/81   Wt Readings from Last 3 Encounters:  03/17/13 179 lb (81.194 kg)  02/04/13 177 lb (80.287 kg)  10/08/12 175 lb (79.379 kg)      Review of Systems  Constitutional: Negative for chills, activity change, appetite change, fatigue and unexpected weight change.  HENT: Negative for congestion, mouth sores, postnasal drip and sinus pressure.   Eyes: Negative for visual disturbance.  Respiratory: Positive for shortness of breath and wheezing. Negative for cough and chest tightness.   Gastrointestinal: Negative for nausea and abdominal pain.  Genitourinary: Negative for frequency, difficulty urinating and vaginal pain.  Musculoskeletal: Negative for back pain and gait problem.  Skin: Negative for pallor and rash.  Neurological: Negative for dizziness, tremors, weakness and numbness.  Psychiatric/Behavioral: Negative for suicidal ideas, hallucinations, confusion, sleep disturbance and dysphoric mood. The patient is not nervous/anxious.        Objective:   Physical Exam  Constitutional: She appears well-developed. No distress.  Obese   HENT:  Head: Normocephalic.  Right Ear: External ear normal.  Left Ear: External ear normal.  Nose: Nose normal.  Mouth/Throat: Oropharynx is clear and moist.  Eyes: Conjunctivae are normal. Pupils are equal, round, and reactive to light. Right eye exhibits no discharge. Left eye exhibits no discharge.  Neck: Normal range of motion. Neck supple. No JVD present. No tracheal deviation present. No thyromegaly present.  Cardiovascular: Normal rate, regular rhythm and normal heart sounds.   Pulmonary/Chest: No stridor. No respiratory distress. She has no wheezes.  Abdominal: Soft. Bowel sounds are normal. She exhibits no distension and no mass. There is no tenderness. There is no rebound and no guarding.  Musculoskeletal: She exhibits no edema and no tenderness.  Lymphadenopathy:    She has no cervical adenopathy.  Neurological: She displays normal reflexes. No cranial nerve deficit. She exhibits normal muscle tone. Coordination normal.  Skin: No rash noted. No erythema.  Psychiatric: She has a normal mood and affect. Her behavior is normal. Judgment and thought content normal.     Lab Results  Component Value Date   WBC 4.9 10/08/2012   HGB 12.0 10/08/2012   HCT 37.3 10/08/2012   PLT 211 10/08/2012   GLUCOSE 95 10/08/2012   CHOL 251* 06/04/2012   TRIG 114.0 06/04/2012   HDL 52.80 06/04/2012   LDLDIRECT 157.3 06/04/2012   ALT 19 10/08/2012   AST 19 10/08/2012   NA 141 10/08/2012   K 4.2 10/08/2012   CL 106 10/08/2012   CREATININE 0.61 10/08/2012   BUN 16 10/08/2012   CO2 26 10/08/2012   TSH 2.15 06/04/2012   INR 0.9 ratio 09/05/2009        Assessment &  Plan:

## 2013-03-17 NOTE — Assessment & Plan Note (Signed)
Add HCTZ Hold Aromasin

## 2013-03-17 NOTE — Assessment & Plan Note (Signed)
Decrease effexor from 75 to 37.5 mg/d

## 2013-03-17 NOTE — Progress Notes (Signed)
Pre visit review using our clinic review tool, if applicable. No additional management support is needed unless otherwise documented below in the visit note. 

## 2013-03-28 ENCOUNTER — Encounter: Payer: Self-pay | Admitting: *Deleted

## 2013-04-12 ENCOUNTER — Telehealth: Payer: Self-pay | Admitting: Obstetrics & Gynecology

## 2013-04-19 ENCOUNTER — Telehealth: Payer: Self-pay | Admitting: *Deleted

## 2013-04-19 NOTE — Telephone Encounter (Signed)
Need to call [employer] at [phone]. Will fax updated letter for Benicar 40mg   and OV note to appeals dept at [phone].

## 2013-04-19 NOTE — Telephone Encounter (Signed)
Spoke with pt she states she needs Benicar 40mg .  She does not want Beincar/HCTZ.

## 2013-04-19 NOTE — Telephone Encounter (Signed)
Called pt-- We received a fax from [facility] for PA on Benicar 40 mg. However, Benicar 40 mg has been discontinued in EMR. A completed PA form was faxed to ins for Benicar 40 mg. The original was denied so an appeal letter was faxed to ins for Benicar HCTZ as reflected on med list. I called pt and left a message with her co worker to have her return my call. I need to know which med is correct and what is needed from [place]. Is it Benicar 40 mg like the pharmacy is requesting or Benicar/HCTZ 40/25 mg?

## 2013-04-21 ENCOUNTER — Ambulatory Visit: Payer: PRIVATE HEALTH INSURANCE | Admitting: Internal Medicine

## 2013-04-21 NOTE — Telephone Encounter (Signed)
I faxed updated appeal letter dated 04/20/13 and 02/04/13 OV note to appeals dept at [phone].

## 2013-04-27 ENCOUNTER — Encounter: Payer: Self-pay | Admitting: Internal Medicine

## 2013-04-27 ENCOUNTER — Ambulatory Visit (INDEPENDENT_AMBULATORY_CARE_PROVIDER_SITE_OTHER): Payer: PRIVATE HEALTH INSURANCE | Admitting: Internal Medicine

## 2013-04-27 VITALS — BP 140/92 | Temp 97.1°F | Wt 178.0 lb

## 2013-04-27 DIAGNOSIS — M545 Low back pain, unspecified: Secondary | ICD-10-CM

## 2013-04-27 DIAGNOSIS — I10 Essential (primary) hypertension: Secondary | ICD-10-CM

## 2013-04-27 DIAGNOSIS — R635 Abnormal weight gain: Secondary | ICD-10-CM

## 2013-04-27 DIAGNOSIS — R1032 Left lower quadrant pain: Secondary | ICD-10-CM

## 2013-04-27 DIAGNOSIS — R110 Nausea: Secondary | ICD-10-CM

## 2013-04-27 DIAGNOSIS — G43019 Migraine without aura, intractable, without status migrainosus: Secondary | ICD-10-CM

## 2013-04-27 MED ORDER — CIPROFLOXACIN HCL 500 MG PO TABS: 500.0000 mg | ORAL_TABLET | Freq: Two times a day (BID) | ORAL | Status: DC

## 2013-04-27 NOTE — Assessment & Plan Note (Signed)
Chronic. 

## 2013-04-27 NOTE — Progress Notes (Signed)
Pre visit review using our clinic review tool, if applicable. No additional management support is needed unless otherwise documented below in the visit note. 

## 2013-04-27 NOTE — Progress Notes (Signed)
Subjective:   C/o HA, nausea, bitter taste in the mouth x 1 wk, not feeling well No new meds   Hypertension The problem has been waxing and waning since onset. Associated symptoms include shortness of breath.  Shortness of Breath This is a new problem. The current episode started in the past 7 days. Associated symptoms include wheezing. Pertinent negatives include no abdominal pain or rash.  Wheezing  Associated symptoms include shortness of breath. Pertinent negatives include no abdominal pain, chills, coughing or rash.      The patient needs to address  chronic hypertension that has been not controlled with medicines; to address chronic  hyperlipidemia controlled with medicines as well; and to address type 2 chronic pre-diabetes, controlled with medical treatment and diet.  C/o meds are too $$$ w/new insurance  BP Readings from Last 3 Encounters:  04/27/13 140/92  03/17/13 160/102  02/04/13 116/98   Wt Readings from Last 3 Encounters:  04/27/13 178 lb (80.74 kg)  03/17/13 179 lb (81.194 kg)  02/04/13 177 lb (80.287 kg)      Review of Systems  Constitutional: Negative for chills, activity change, appetite change, fatigue and unexpected weight change.  HENT: Negative for congestion, mouth sores, postnasal drip and sinus pressure.   Eyes: Negative for visual disturbance.  Respiratory: Positive for shortness of breath and wheezing. Negative for cough and chest tightness.   Gastrointestinal: Negative for nausea and abdominal pain.  Genitourinary: Negative for frequency, difficulty urinating and vaginal pain.  Musculoskeletal: Negative for back pain and gait problem.  Skin: Negative for pallor and rash.  Neurological: Negative for dizziness, tremors, weakness and numbness.  Psychiatric/Behavioral: Negative for suicidal ideas, hallucinations, confusion, sleep disturbance and dysphoric mood. The patient is not nervous/anxious.        Objective:   Physical Exam   Constitutional: She appears well-developed. No distress.  Obese  HENT:  Head: Normocephalic.  Right Ear: External ear normal.  Left Ear: External ear normal.  Nose: Nose normal.  Mouth/Throat: Oropharynx is clear and moist.  Eyes: Conjunctivae are normal. Pupils are equal, round, and reactive to light. Right eye exhibits no discharge. Left eye exhibits no discharge.  Neck: Normal range of motion. Neck supple. No JVD present. No tracheal deviation present. No thyromegaly present.  Cardiovascular: Normal rate, regular rhythm and normal heart sounds.   Pulmonary/Chest: No stridor. No respiratory distress. She has no wheezes.  Abdominal: Soft. Bowel sounds are normal. She exhibits no distension and no mass. There is no tenderness. There is no rebound and no guarding.  Musculoskeletal: She exhibits no edema and no tenderness.  Lymphadenopathy:    She has no cervical adenopathy.  Neurological: She displays normal reflexes. No cranial nerve deficit. She exhibits normal muscle tone. Coordination normal.  Skin: No rash noted. No erythema.  Psychiatric: She has a normal mood and affect. Her behavior is normal. Judgment and thought content normal.     Lab Results  Component Value Date   WBC 4.9 10/08/2012   HGB 12.0 10/08/2012   HCT 37.3 10/08/2012   PLT 211 10/08/2012   GLUCOSE 84 03/17/2013   CHOL 251* 06/04/2012   TRIG 114.0 06/04/2012   HDL 52.80 06/04/2012   LDLDIRECT 157.3 06/04/2012   ALT 19 10/08/2012   AST 19 10/08/2012   NA 140 03/17/2013   K 4.1 03/17/2013   CL 104 03/17/2013   CREATININE 0.6 03/17/2013   BUN 15 03/17/2013   CO2 30 03/17/2013   TSH 1.34 03/17/2013  INR 0.9 ratio 09/05/2009        Assessment & Plan:

## 2013-04-28 NOTE — Assessment & Plan Note (Signed)
Wt Readings from Last 3 Encounters:  04/27/13 178 lb (80.74 kg)  03/17/13 179 lb (81.194 kg)  02/04/13 177 lb (80.287 kg)

## 2013-04-28 NOTE — Assessment & Plan Note (Signed)
See meds 

## 2013-04-28 NOTE — Assessment & Plan Note (Addendum)
See Med change  BP Readings from Last 3 Encounters:  04/27/13 140/92  03/17/13 160/102  02/04/13 116/98

## 2013-05-03 ENCOUNTER — Ambulatory Visit (INDEPENDENT_AMBULATORY_CARE_PROVIDER_SITE_OTHER): Payer: PRIVATE HEALTH INSURANCE | Admitting: *Deleted

## 2013-05-03 DIAGNOSIS — Z23 Encounter for immunization: Secondary | ICD-10-CM

## 2013-05-10 ENCOUNTER — Other Ambulatory Visit (INDEPENDENT_AMBULATORY_CARE_PROVIDER_SITE_OTHER): Payer: PRIVATE HEALTH INSURANCE

## 2013-05-10 DIAGNOSIS — M545 Low back pain, unspecified: Secondary | ICD-10-CM

## 2013-05-10 DIAGNOSIS — R1032 Left lower quadrant pain: Secondary | ICD-10-CM

## 2013-05-10 LAB — URINALYSIS
Bilirubin Urine: NEGATIVE
Hgb urine dipstick: NEGATIVE
Ketones, ur: NEGATIVE
Leukocytes, UA: NEGATIVE
Nitrite: NEGATIVE
Specific Gravity, Urine: 1.02 (ref 1.000–1.030)
Total Protein, Urine: NEGATIVE
Urobilinogen, UA: 0.2 (ref 0.0–1.0)
pH: 6.5 (ref 5.0–8.0)

## 2013-05-10 LAB — CBC WITH DIFFERENTIAL/PLATELET
Basophils Absolute: 0 10*3/uL (ref 0.0–0.1)
Eosinophils Absolute: 0 10*3/uL (ref 0.0–0.7)
Eosinophils Relative: 1 % (ref 0.0–5.0)
HCT: 37 % (ref 36.0–46.0)
Hemoglobin: 12.4 g/dL (ref 12.0–15.0)
Lymphs Abs: 1.1 10*3/uL (ref 0.7–4.0)
MCHC: 33.6 g/dL (ref 30.0–36.0)
MCV: 86.9 fl (ref 78.0–100.0)
Monocytes Absolute: 0.4 10*3/uL (ref 0.1–1.0)
Monocytes Relative: 8.4 % (ref 3.0–12.0)
Neutro Abs: 2.9 10*3/uL (ref 1.4–7.7)
Neutrophils Relative %: 66 % (ref 43.0–77.0)
Platelets: 240 10*3/uL (ref 150.0–400.0)
RDW: 13.9 % (ref 11.5–14.6)
WBC: 4.4 10*3/uL — ABNORMAL LOW (ref 4.5–10.5)

## 2013-05-10 LAB — HEPATIC FUNCTION PANEL
ALT: 36 U/L — ABNORMAL HIGH (ref 0–35)
AST: 28 U/L (ref 0–37)
Albumin: 4.1 g/dL (ref 3.5–5.2)
Alkaline Phosphatase: 77 U/L (ref 39–117)
Bilirubin, Direct: 0.1 mg/dL (ref 0.0–0.3)
Total Bilirubin: 0.7 mg/dL (ref 0.3–1.2)
Total Protein: 6.9 g/dL (ref 6.0–8.3)

## 2013-05-10 LAB — BASIC METABOLIC PANEL
CO2: 30 mEq/L (ref 19–32)
Calcium: 9.5 mg/dL (ref 8.4–10.5)
Chloride: 105 mEq/L (ref 96–112)
Potassium: 4.2 mEq/L (ref 3.5–5.1)
Sodium: 141 mEq/L (ref 135–145)

## 2013-05-10 LAB — SEDIMENTATION RATE: Sed Rate: 28 mm/hr — ABNORMAL HIGH (ref 0–22)

## 2013-05-16 ENCOUNTER — Telehealth: Payer: Self-pay | Admitting: Obstetrics & Gynecology

## 2013-05-16 NOTE — Telephone Encounter (Signed)
Spoke with pt who is having burning irritation on right breast. Pt is at work and cannot talk now. Scheduled appt tomorrow at 3:45 with SM per pt request.

## 2013-05-16 NOTE — Telephone Encounter (Signed)
Pt is having burning irritation on (R) breast would like an appt today or tomorrow.

## 2013-05-17 ENCOUNTER — Encounter: Payer: Self-pay | Admitting: Obstetrics & Gynecology

## 2013-05-17 ENCOUNTER — Ambulatory Visit: Payer: PRIVATE HEALTH INSURANCE | Admitting: Internal Medicine

## 2013-05-17 ENCOUNTER — Ambulatory Visit (INDEPENDENT_AMBULATORY_CARE_PROVIDER_SITE_OTHER): Payer: No Typology Code available for payment source | Admitting: Obstetrics & Gynecology

## 2013-05-17 VITALS — BP 120/82 | HR 60 | Resp 16 | Ht 65.0 in | Wt 181.6 lb

## 2013-05-17 DIAGNOSIS — Z01419 Encounter for gynecological examination (general) (routine) without abnormal findings: Secondary | ICD-10-CM

## 2013-05-17 DIAGNOSIS — E2839 Other primary ovarian failure: Secondary | ICD-10-CM

## 2013-05-17 NOTE — Patient Instructions (Signed)

## 2013-05-17 NOTE — Progress Notes (Signed)
53 y.o. G4P1 MarriedCaucasianF here for annual exam.  No vaginal bleeding.  Doing well except for having some burning breast pain.  Sarah Waller is going to be a grandmother in late April/early May.    Reports Sarah Waller had "illness" for three weeks in December.  Had headache, nausea, fatigue, joint pains.  Last week, it just started to go away.  Feeling better now.    Pt will do MRI this year.  Patient's last menstrual period was 01/11/2011.          Sexually active: yes  The current method of family planning is post menopausal status.    Exercising: yes   Smoker:  no  Health Maintenance: Pap:  07/22/11 WNL/negative HR HPV History of abnormal Pap:  no MMG:  08/27/12 3D normal Colonoscopy:  2010 repeat in 10 years, Dr. Olevia Perches BMD:   4/14, -1.3 TDaP:  12/14 Screening Labs: PCP, Hb today: PCP, Urine today: PCP   reports that Sarah Waller has never smoked. Sarah Waller has never used smokeless tobacco. Sarah Waller reports that Sarah Waller does not drink alcohol or use illicit drugs.  Past Medical History  Diagnosis Date  . Painful respiration   . Nonspecific abnormal electrocardiogram (ECG) (EKG)   . Pain in limb   . Pain in joint, shoulder region   . Headache(784.0)   . Migraine without aura, with intractable migraine, so stated, without mention of status migrainosus   . Cystitis, unspecified   . Lumbago   . Paroxysmal tachycardia, unspecified   . Irritable bowel syndrome   . Vomiting alone   . Flatulence, eructation, and gas pain   . Personal history of contact with and (suspected) exposure to potentially hazardous body fluids   . Personal history of malignant neoplasm of breast   . Rash and other nonspecific skin eruption   . Other specified disorder of rectum and anus   . Peptic ulcer, unspecified site, unspecified as acute or chronic, without mention of hemorrhage, perforation, or obstruction   . Diaphragmatic hernia without mention of obstruction or gangrene   . Allergic rhinitis, cause unspecified   . Generalized  hyperhidrosis   . Malignant neoplasm of breast (female), unspecified site   . Dizziness and giddiness   . Abnormal weight gain   . Cervicalgia   . Esophageal reflux   . Unspecified asthma(493.90)   . Depression     Past Surgical History  Procedure Laterality Date  . Breast lumpectomy    . Tumor removal      subcutaneous of right thigh  . Hysteroscopy w/d&c    . Teeth implant  7/09    2 teeth  . Left leg tumor removal  2002    benign  . Lipoma excision  2002    left shoulder    Current Outpatient Prescriptions  Medication Sig Dispense Refill  . BENICAR 40 MG tablet 40 mg daily.      . Vitamin D, Ergocalciferol, (DRISDOL) 50000 UNITS CAPS capsule Take 50,000 Units by mouth every 14 (fourteen) days.      . diclofenac sodium (VOLTAREN) 1 % GEL Apply 4 g topically 4 (four) times daily.  200 g  3  . EPINEPHrine (EPIPEN 2-PAK) 0.3 mg/0.3 mL DEVI Inject 0.3 mg into the muscle once.        Marland Kitchen omeprazole-sodium bicarbonate (ZEGERID) 40-1100 MG per capsule Take 1 capsule by mouth daily before breakfast.  90 capsule  2  . ondansetron (ZOFRAN ODT) 4 MG disintegrating tablet Take 1 tablet (4 mg total) by  mouth every 8 (eight) hours as needed for nausea.  20 tablet  0  . zolmitriptan (ZOMIG) 5 MG nasal solution Place 1 spray into the nose as needed for migraine.  6 Units  5   Current Facility-Administered Medications  Medication Dose Route Frequency Provider Last Rate Last Dose  . methylPREDNISolone acetate (DEPO-MEDROL) injection 80 mg  80 mg Intra-articular Once Cassandria Anger, MD        Family History  Problem Relation Age of Onset  . Colon cancer Neg Hx   . Hypertension Other   . COPD Mother   . Diabetes Mother   . Heart disease Mother   . Hypertension Mother     ROS:  Pertinent items are noted in HPI.  Otherwise, a comprehensive ROS was negative.  Exam:   BP 120/82  Pulse 60  Resp 16  Ht 5\' 5"  (1.651 m)  Wt 181 lb 9.6 oz (82.373 kg)  BMI 30.22 kg/m2  LMP 01/11/2011   Weight change: -10lb   Height: 5\' 5"  (165.1 cm)  Ht Readings from Last 3 Encounters:  05/17/13 5\' 5"  (1.651 m)  10/08/12 5\' 5"  (1.651 m)  08/18/12 5' 5.75" (1.67 m)    General appearance: alert, cooperative and appears stated age Head: Normocephalic, without obvious abnormality, atraumatic Neck: no adenopathy, supple, symmetrical, trachea midline and thyroid normal to inspection and palpation Lungs: clear to auscultation bilaterally Breasts: normal appearance, no masses or tenderness Heart: regular rate and rhythm Abdomen: soft, non-tender; bowel sounds normal; no masses,  no organomegaly Extremities: extremities normal, atraumatic, no cyanosis or edema Skin: Skin color, texture, turgor normal. No rashes or lesions Lymph nodes: Cervical, supraclavicular, and axillary nodes normal. No abnormal inguinal nodes palpated Neurologic: Grossly normal   Pelvic: External genitalia:  no lesions              Urethra:  normal appearing urethra with no masses, tenderness or lesions              Bartholins and Skenes: normal                 Vagina: normal appearing vagina with normal color and discharge, no lesions              Cervix: no lesions              Pap taken: no Bimanual Exam:  Uterus:  normal size, contour, position, consistency, mobility, non-tender              Adnexa: normal adnexa and no mass, fullness, tenderness               Rectovaginal: Confirms               Anus:  normal sphincter tone, no lesions  A:  Well Woman with normal exam H/O breast cancer 1 week of burning left breast pain  P:   Mammogram yearly.  Will do MRI this year. pap smear with neg HR HPV 3/13.  No pain.  Sees oncologist yearly.   return annually or prn  An After Visit Summary was printed and given to the patient.

## 2013-05-30 ENCOUNTER — Encounter: Payer: Self-pay | Admitting: Family Medicine

## 2013-05-30 ENCOUNTER — Ambulatory Visit (INDEPENDENT_AMBULATORY_CARE_PROVIDER_SITE_OTHER): Payer: PRIVATE HEALTH INSURANCE | Admitting: Family Medicine

## 2013-05-30 ENCOUNTER — Encounter: Payer: Self-pay | Admitting: *Deleted

## 2013-05-30 VITALS — BP 148/110 | HR 86 | Temp 97.7°F | Resp 18 | Wt 184.1 lb

## 2013-05-30 DIAGNOSIS — H6121 Impacted cerumen, right ear: Secondary | ICD-10-CM

## 2013-05-30 DIAGNOSIS — H6120 Impacted cerumen, unspecified ear: Secondary | ICD-10-CM

## 2013-05-30 MED ORDER — AMOXICILLIN-POT CLAVULANATE 875-125 MG PO TABS: 1.0000 | ORAL_TABLET | Freq: Two times a day (BID) | ORAL | Status: DC

## 2013-05-30 NOTE — Progress Notes (Signed)
SUBJECTIVE:  Sarah Waller is a 53 y.o. female who complains of congestion, sneezing, sore throat, nasal blockage and right ear pain and fullness for 10 days.  Just returned from [place].  Positive sick contact and has been  She denies a history of chest pain, chills, fevers, myalgias, shortness of breath, weakness and weight loss and does have a history of asthma. Patient denies smoke cigarettes.  Past Medical History  Diagnosis Date  . Painful respiration   . Nonspecific abnormal electrocardiogram (ECG) (EKG)   . Pain in limb   . Pain in joint, shoulder region   . Headache(784.0)   . Migraine without aura, with intractable migraine, so stated, without mention of status migrainosus   . Cystitis, unspecified   . Lumbago   . Paroxysmal tachycardia, unspecified   . Irritable bowel syndrome   . Vomiting alone   . Flatulence, eructation, and gas pain   . Personal history of contact with and (suspected) exposure to potentially hazardous body fluids   . Personal history of malignant neoplasm of breast   . Rash and other nonspecific skin eruption   . Other specified disorder of rectum and anus   . Peptic ulcer, unspecified site, unspecified as acute or chronic, without mention of hemorrhage, perforation, or obstruction   . Diaphragmatic hernia without mention of obstruction or gangrene   . Allergic rhinitis, cause unspecified   . Generalized hyperhidrosis   . Malignant neoplasm of breast (female), unspecified site   . Dizziness and giddiness   . Abnormal weight gain   . Cervicalgia   . Esophageal reflux   . Unspecified asthma(493.90)   . Depression    Past Surgical History  Procedure Laterality Date  . Breast lumpectomy    . Tumor removal      subcutaneous of right thigh  . Hysteroscopy w/d&c    . Teeth implant  7/09    2 teeth  . Left leg tumor removal  2002    benign  . Lipoma excision  2002    left shoulder   History   Social History  . Marital Status: Married   Spouse Name: N/A    Number of Children: N/A  . Years of Education: N/A   Social History Main Topics  . Smoking status: Never Smoker   . Smokeless tobacco: Never Used  . Alcohol Use: No  . Drug Use: No  . Sexual Activity: Yes    Partners: Male    Birth Control/ Protection: Post-menopausal   Other Topics Concern  . None   Social History Narrative  . None      OBJECTIVE: Blood pressure 148/110, pulse 86, temperature 97.7 F (36.5 C), temperature source Oral, resp. rate 18, weight 184 lb 1.9 oz (83.516 kg), last menstrual period 01/11/2011, SpO2 95.00%.  She appears well, vital signs are as noted. Ears patient has impaction on the right external ear canal. Tympanic membrane was unable to be visualized until cleaning. Once visualized mild erythema with mild bulging questionable infection..  Throat and pharynx mild erythema.  Neck supple. No adenopathy in the neck. Nose is congested. Sinuses non tender. The chest is clear, without wheezes or rales.  ASSESSMENT:  otitis media and strep pharyngitis  PLAN: Symptomatic therapy suggested: push fluids, rest and return office visit prn if symptoms persist or worsen. Medications per orders. Call or return to clinic prn if these symptoms worsen or fail to improve as anticipated.

## 2013-05-30 NOTE — Progress Notes (Signed)
Pre-visit discussion using our clinic review tool. No additional management support is needed unless otherwise documented below in the visit note.  

## 2013-05-30 NOTE — Patient Instructions (Signed)
Good to see you Try augmentin 2 times daily We will clean you ear out today If not better next week come back .  Sinusitis Sinusitis is redness, soreness, and swelling (inflammation) of the paranasal sinuses. Paranasal sinuses are air pockets within the bones of your face (beneath the eyes, the middle of the forehead, or above the eyes). In healthy paranasal sinuses, mucus is able to drain out, and air is able to circulate through them by way of your nose. However, when your paranasal sinuses are inflamed, mucus and air can become trapped. This can allow bacteria and other germs to grow and cause infection. Sinusitis can develop quickly and last only a short time (acute) or continue over a long period (chronic). Sinusitis that lasts for more than 12 weeks is considered chronic.  CAUSES  Causes of sinusitis include:  Allergies.  Structural abnormalities, such as displacement of the cartilage that separates your nostrils (deviated septum), which can decrease the air flow through your nose and sinuses and affect sinus drainage.  Functional abnormalities, such as when the small hairs (cilia) that line your sinuses and help remove mucus do not work properly or are not present. SYMPTOMS  Symptoms of acute and chronic sinusitis are the same. The primary symptoms are pain and pressure around the affected sinuses. Other symptoms include:  Upper toothache.  Earache.  Headache.  Bad breath.  Decreased sense of smell and taste.  A cough, which worsens when you are lying flat.  Fatigue.  Fever.  Thick drainage from your nose, which often is green and may contain pus (purulent).  Swelling and warmth over the affected sinuses. DIAGNOSIS  Your caregiver will perform a physical exam. During the exam, your caregiver may:  Look in your nose for signs of abnormal growths in your nostrils (nasal polyps).  Tap over the affected sinus to check for signs of infection.  View the inside of your  sinuses (endoscopy) with a special imaging device with a light attached (endoscope), which is inserted into your sinuses. If your caregiver suspects that you have chronic sinusitis, one or more of the following tests may be recommended:  Allergy tests.  Nasal culture A sample of mucus is taken from your nose and sent to a lab and screened for bacteria.  Nasal cytology A sample of mucus is taken from your nose and examined by your caregiver to determine if your sinusitis is related to an allergy. TREATMENT  Most cases of acute sinusitis are related to a viral infection and will resolve on their own within 10 days. Sometimes medicines are prescribed to help relieve symptoms (pain medicine, decongestants, nasal steroid sprays, or saline sprays).  However, for sinusitis related to a bacterial infection, your caregiver will prescribe antibiotic medicines. These are medicines that will help kill the bacteria causing the infection.  Rarely, sinusitis is caused by a fungal infection. In theses cases, your caregiver will prescribe antifungal medicine. For some cases of chronic sinusitis, surgery is needed. Generally, these are cases in which sinusitis recurs more than 3 times per year, despite other treatments. HOME CARE INSTRUCTIONS   Drink plenty of water. Water helps thin the mucus so your sinuses can drain more easily.  Use a humidifier.  Inhale steam 3 to 4 times a day (for example, sit in the bathroom with the shower running).  Apply a warm, moist washcloth to your face 3 to 4 times a day, or as directed by your caregiver.  Use saline nasal sprays to help  moisten and clean your sinuses.  Take over-the-counter or prescription medicines for pain, discomfort, or fever only as directed by your caregiver. SEEK IMMEDIATE MEDICAL CARE IF:  You have increasing pain or severe headaches.  You have nausea, vomiting, or drowsiness.  You have swelling around your face.  You have vision  problems.  You have a stiff neck.  You have difficulty breathing. MAKE SURE YOU:   Understand these instructions.  Will watch your condition.  Will get help right away if you are not doing well or get worse. Document Released: 04/28/2005 Document Revised: 07/21/2011 Document Reviewed: 05/13/2011 [facility] Patient Information 2014 [place], [place].

## 2013-06-07 ENCOUNTER — Telehealth: Payer: Self-pay

## 2013-06-07 MED ORDER — FLUCONAZOLE 150 MG PO TABS: 150.0000 mg | ORAL_TABLET | Freq: Once | ORAL | Status: DC

## 2013-06-07 NOTE — Telephone Encounter (Signed)
The patient called the triage line on 1/26 and is hoping to get a medication called in for a yeast infection.  She states the antibiotic she was prescribed has caused her this problem.  She is hoping this med can be called into the [facility] on [address].

## 2013-06-07 NOTE — Telephone Encounter (Signed)
Done

## 2013-07-13 ENCOUNTER — Telehealth: Payer: Self-pay

## 2013-07-13 NOTE — Telephone Encounter (Signed)
Spoke with patient-states she is doing well. The breast pain is completely gone. Will call back PRN//kn

## 2013-07-13 NOTE — Telephone Encounter (Signed)
Message copied by Robley Fries on Wed Jul 13, 2013 10:11 AM ------      Message from: Megan Salon      Created: Tue Jun 14, 2013  5:25 AM      Regarding: FW: check on breast pain       Can you check on Mrs. Illingworth about her breast pain?  Just want to see if it is any better?            MSM      ----- Message -----         From: Lyman Speller, MD         Sent: 05/17/2013   6:07 PM           To: Lyman Speller, MD      Subject: check on breast pain                                            ------

## 2013-07-18 ENCOUNTER — Telehealth: Payer: Self-pay | Admitting: Hematology & Oncology

## 2013-07-18 NOTE — Telephone Encounter (Signed)
Left pt message with 5-22 appointment

## 2013-08-25 ENCOUNTER — Telehealth: Payer: Self-pay | Admitting: Internal Medicine

## 2013-08-25 ENCOUNTER — Other Ambulatory Visit: Payer: PRIVATE HEALTH INSURANCE

## 2013-08-25 ENCOUNTER — Encounter: Payer: Self-pay | Admitting: Obstetrics & Gynecology

## 2013-08-25 ENCOUNTER — Telehealth: Payer: Self-pay | Admitting: Obstetrics & Gynecology

## 2013-08-25 ENCOUNTER — Ambulatory Visit (INDEPENDENT_AMBULATORY_CARE_PROVIDER_SITE_OTHER): Payer: No Typology Code available for payment source | Admitting: Obstetrics & Gynecology

## 2013-08-25 VITALS — BP 136/90 | HR 80 | Resp 18 | Ht 65.0 in | Wt 177.0 lb

## 2013-08-25 DIAGNOSIS — Z7721 Contact with and (suspected) exposure to potentially hazardous body fluids: Secondary | ICD-10-CM

## 2013-08-25 DIAGNOSIS — N950 Postmenopausal bleeding: Secondary | ICD-10-CM

## 2013-08-25 NOTE — Telephone Encounter (Signed)
Patient requesting to speak with nurse about being worked in with Dr. Sabra Heck late this afternoon for a problem she reports having with discharge. Patient aware there are no late afternoon appointments with Dr. Sabra Heck today. 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 Sabra Heck, I am sure she will see me late this afternoon." Patient declined to see anyone else.

## 2013-08-25 NOTE — Telephone Encounter (Signed)
The patient called back hoping to hear from the [place] regarding this incident.  She states she needs the order sent to the lab so they can test her blood for possible HIV.   Thanks!

## 2013-08-25 NOTE — Telephone Encounter (Signed)
Spoke with patient at work number. She is calling to report that starting yesterday, she is having "a brown discharge that looks like at the end of my period" and intermittent lower pelvic pain that "feels like my period is about to start." Patient states she has not had a period in greater than 3 years. She is requesting an appointment for this afternoon with Dr. Sabra Heck if possible.   Advised would discuss with Dr. Sabra Heck and return her call. She is agreeable.

## 2013-08-25 NOTE — Telephone Encounter (Signed)
Late entry for 1120: Spoke with patient. Advised Dr. Sabra Heck would like patient to come for office visit. Okay for 1600 office visit.  Patient states at this time, she has had a work injury and needs blood work drawn. Advised patient that we have the capacity to have blood work drawn at this office but that since it may be work related, would need to go through the channels at her work. Patient agreeable. She states she will call her pcp. Will come today at 1600, advised to take 800 mg Motrin PO one hour prior. Order for endometria bx placed.  Routing to provider for final review. Patient agreeable to disposition. Will close encounter

## 2013-08-25 NOTE — Telephone Encounter (Signed)
HIV test ordered. I called pt- she wasn't available.

## 2013-08-25 NOTE — Telephone Encounter (Signed)
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 Patient is asking for a lab order so she can come get this done. Please call patient to let her know if she can come to get this done. Please call patient on work number to let her know.

## 2013-08-25 NOTE — Telephone Encounter (Signed)
OK HIV test and 1 extra tube to hold serum Thx

## 2013-08-26 ENCOUNTER — Ambulatory Visit: Payer: PRIVATE HEALTH INSURANCE | Admitting: Internal Medicine

## 2013-08-27 LAB — HIV-1 RNA ULTRAQUANT REFLEX TO GENTYP+: HIV-1 RNA Quant, Log: <1.3 {Log} (ref <1.30)

## 2013-08-31 NOTE — Progress Notes (Signed)
Subjective:     Patient ID: Sarah Waller, female   DOB: Aug 23, 1960, 53 y.o.   MRN: [id number]  Vaginal Discharge The patient's primary symptoms include a vaginal discharge.   Well know 53 yo MWF here with two days of increased breast tenderness and cramping with PMP bleeding.  Pt with hx of breast cancer.  Took Tamoxifen post chemotherapy.  Pt had episode of bleeding several years ago with a polyp found on evaluation.  She underwent a hysteroscopy with polyp resection.  She is always anxious about any bleeding, of course, and is here for biopsy.  She does feel like her symptoms this week are like "PMS" symptoms.    Review of Systems  Genitourinary: Positive for vaginal discharge.  All other systems reviewed and are negative.      Objective:   Physical Exam  Constitutional: She is oriented to person, place, and time. She appears well-developed and well-nourished.  Genitourinary: Uterus normal. There is no rash or tenderness on the right labia. There is no rash or tenderness on the left labia. Cervix exhibits no discharge. Right adnexum displays no mass and no tenderness. Left adnexum displays no mass and no tenderness. There is bleeding around the vagina. No vaginal discharge found.  Lymphadenopathy:       Right: No inguinal adenopathy present.       Left: No inguinal adenopathy present.  Neurological: She is alert and oriented to person, place, and time.  Skin: Skin is warm and dry.  Psychiatric: She has a normal mood and affect.   Endometrial biopsy recommended.  Discussed with patient.  Verbal and written consent obtained.   Procedure:  Speculum placed.  Cervix visualized and cleansed with betadine prep.  A single toothed tenaculum was applied to the anterior lip of the cervix.  Endometrial pipelle was advanced through the cervix into the endometrial cavity without difficulty.  Pipelle passed to 7cm.  Suction applied and pipelle removed with good tissue sample obtained.  Tenculum  removed.  No bleeding noted.  Patient tolerated procedure well.     Assessment:     PMP bleeding in patient with hx of breast cancer and Tamoxifen use H/O endometrial polyp    Plan:     Endometrial biopsy pending.  If negative, will follow conservatively.  It pt has repeat bleeding within next six months, will proceed with [facility].

## 2013-09-02 ENCOUNTER — Telehealth: Payer: Self-pay | Admitting: Emergency Medicine

## 2013-09-02 ENCOUNTER — Telehealth: Payer: Self-pay | Admitting: Internal Medicine

## 2013-09-02 DIAGNOSIS — Z7721 Contact with and (suspected) exposure to potentially hazardous body fluids: Secondary | ICD-10-CM

## 2013-09-02 NOTE — Telephone Encounter (Signed)
Patient is calling tracy back °

## 2013-09-02 NOTE — Telephone Encounter (Signed)
Message left to return call to [place] at [phone].   Detailed messages okay per DPR on Mobile. Advised that I was calling to follow up with her appointment with Dr. Sabra Heck to ensure she didn't have any questions related to results on mychart. Requested return call if questions.

## 2013-09-02 NOTE — Telephone Encounter (Signed)
Pt wants the results of her lab test.  Called several times.

## 2013-09-02 NOTE — Telephone Encounter (Signed)
Patient is calling about results from biopsy °

## 2013-09-02 NOTE — Telephone Encounter (Signed)
Ok Thx 

## 2013-09-02 NOTE — Telephone Encounter (Signed)
Message copied by Michele Mcalpine on Fri Sep 02, 2013  9:06 AM ------      Message from: Megan Salon      Created: Wed Aug 31, 2013  8:01 AM       Informed via Mychart but please call and just make sure she doesn't have any questions. ------

## 2013-09-02 NOTE — Telephone Encounter (Signed)
Please order Hep labs

## 2013-09-02 NOTE — Telephone Encounter (Signed)
Spoke with patient. She states she is unable to access mychart. Message from Dr. Sabra Heck given, advised biopsy was negative for malignancy. Patient states that bleeding/discharge has decreased significantly. She will follow up prn with Dr. Sabra Heck.

## 2013-09-02 NOTE — Telephone Encounter (Signed)
MD sent mychart message the day after labs were resulted. I called her and informed her of this. She requests copy of labs to be faxed to her office manager, Apolonio Schneiders at [phone]. Also, she requests hepatitis testing. Please advise.

## 2013-09-03 NOTE — Telephone Encounter (Signed)
Ok Thx 

## 2013-09-05 NOTE — Telephone Encounter (Signed)
Pt is aware.  

## 2013-09-05 NOTE — Telephone Encounter (Signed)
LMOM to return call.

## 2013-09-06 NOTE — Telephone Encounter (Signed)
Sarah Chafe, RN at 09/02/2013 3:26 PM     Status: Signed        Spoke with patient. She states she is unable to access mychart. Message from Dr. Sabra Heck given, advised biopsy was negative for malignancy. Patient states that bleeding/discharge has decreased significantly. She will follow up prn with Dr. Sabra Heck.

## 2013-09-06 NOTE — Telephone Encounter (Signed)
Patient aware of results. Will close encounter.

## 2013-09-09 ENCOUNTER — Other Ambulatory Visit: Payer: PRIVATE HEALTH INSURANCE

## 2013-09-09 ENCOUNTER — Encounter: Payer: Self-pay | Admitting: Hematology & Oncology

## 2013-09-12 ENCOUNTER — Other Ambulatory Visit: Payer: Self-pay | Admitting: Radiology

## 2013-09-12 ENCOUNTER — Other Ambulatory Visit: Payer: Self-pay | Admitting: *Deleted

## 2013-09-12 DIAGNOSIS — Z7721 Contact with and (suspected) exposure to potentially hazardous body fluids: Secondary | ICD-10-CM

## 2013-09-12 DIAGNOSIS — Z9882 Breast implant status: Secondary | ICD-10-CM

## 2013-09-14 ENCOUNTER — Other Ambulatory Visit: Payer: PRIVATE HEALTH INSURANCE

## 2013-09-14 ENCOUNTER — Other Ambulatory Visit: Payer: Self-pay | Admitting: *Deleted

## 2013-09-14 DIAGNOSIS — Z7721 Contact with and (suspected) exposure to potentially hazardous body fluids: Secondary | ICD-10-CM

## 2013-09-14 DIAGNOSIS — M810 Age-related osteoporosis without current pathological fracture: Secondary | ICD-10-CM

## 2013-09-14 DIAGNOSIS — C50919 Malignant neoplasm of unspecified site of unspecified female breast: Secondary | ICD-10-CM

## 2013-09-15 LAB — HEPATITIS PANEL, ACUTE
HCV AB: NEGATIVE
HEP B C IGM: NONREACTIVE
HEP B S AG: NEGATIVE
Hep A IgM: NONREACTIVE

## 2013-09-16 ENCOUNTER — Ambulatory Visit: Payer: No Typology Code available for payment source | Admitting: Obstetrics & Gynecology

## 2013-09-30 ENCOUNTER — Ambulatory Visit: Payer: PRIVATE HEALTH INSURANCE | Admitting: Hematology & Oncology

## 2013-09-30 ENCOUNTER — Telehealth: Payer: Self-pay | Admitting: Hematology & Oncology

## 2013-09-30 ENCOUNTER — Other Ambulatory Visit: Payer: PRIVATE HEALTH INSURANCE | Admitting: Lab

## 2013-09-30 NOTE — Telephone Encounter (Signed)
Pt called to cx 5-22 said she would call back to reschedule.

## 2013-10-06 ENCOUNTER — Other Ambulatory Visit: Payer: Self-pay | Admitting: Internal Medicine

## 2013-10-07 ENCOUNTER — Ambulatory Visit: Payer: PRIVATE HEALTH INSURANCE | Admitting: Hematology & Oncology

## 2013-10-07 ENCOUNTER — Other Ambulatory Visit: Payer: PRIVATE HEALTH INSURANCE | Admitting: Lab

## 2013-10-07 NOTE — Telephone Encounter (Signed)
Med is d/c. Ok to Rf?

## 2013-10-11 ENCOUNTER — Telehealth: Payer: Self-pay | Admitting: Emergency Medicine

## 2013-10-11 NOTE — Telephone Encounter (Signed)
Follow up from 09/22/13 call with Janett Billow at [facility] who is helping to co-ordinate Bilateral Breast MRI with [place].  She will follow up with [facility] to see if they have been able to reach patient to schedule.

## 2013-10-13 ENCOUNTER — Telehealth: Payer: Self-pay | Admitting: *Deleted

## 2013-10-13 MED ORDER — LOSARTAN POTASSIUM 100 MG PO TABS: 100.0000 mg | ORAL_TABLET | Freq: Every day | ORAL | Status: DC

## 2013-10-13 NOTE — Telephone Encounter (Signed)
Rec ref req for plain Losartan (per ptit was changed). Our chart says Losartan HCTZ. Which is correct?

## 2013-10-13 NOTE — Telephone Encounter (Signed)
OK. Done Thx 

## 2013-11-25 ENCOUNTER — Encounter: Payer: Self-pay | Admitting: Obstetrics & Gynecology

## 2013-12-05 NOTE — Telephone Encounter (Signed)
Letter sent by Dr. Sabra Heck.  Will close encounter

## 2014-03-13 ENCOUNTER — Encounter: Payer: Self-pay | Admitting: Obstetrics & Gynecology

## 2014-04-14 ENCOUNTER — Other Ambulatory Visit: Payer: Self-pay | Admitting: Internal Medicine

## 2014-04-18 ENCOUNTER — Telehealth: Payer: Self-pay | Admitting: Internal Medicine

## 2014-04-18 DIAGNOSIS — N390 Urinary tract infection, site not specified: Secondary | ICD-10-CM

## 2014-04-18 NOTE — Telephone Encounter (Signed)
Pt called in and said that she doesn't have insurance.  She wants to know if Dr Camila Li can put orders in for her to go to LAB for Urine.  She thinks she might have UTI.  If she does she will come in to see Dr Camila Li.  She said that she just doesn't have the money to come in and it not be that and then have to spent more money to go to different dr.

## 2014-04-18 NOTE — Telephone Encounter (Signed)
OK UA Thx

## 2014-04-18 NOTE — Telephone Encounter (Signed)
Notified pt md ok UA has been entered...Sarah Waller

## 2014-04-19 ENCOUNTER — Other Ambulatory Visit (INDEPENDENT_AMBULATORY_CARE_PROVIDER_SITE_OTHER): Payer: PRIVATE HEALTH INSURANCE

## 2014-04-19 DIAGNOSIS — N390 Urinary tract infection, site not specified: Secondary | ICD-10-CM

## 2014-04-19 LAB — URINALYSIS, ROUTINE W REFLEX MICROSCOPIC
BILIRUBIN URINE: NEGATIVE
Hgb urine dipstick: NEGATIVE
Ketones, ur: NEGATIVE
Nitrite: NEGATIVE
Specific Gravity, Urine: 1.01 (ref 1.000–1.030)
TOTAL PROTEIN, URINE-UPE24: NEGATIVE
URINE GLUCOSE: NEGATIVE
UROBILINOGEN UA: 0.2 (ref 0.0–1.0)
pH: 6.5 (ref 5.0–8.0)

## 2014-04-19 MED ORDER — CIPROFLOXACIN HCL 250 MG PO TABS: 250.0000 mg | ORAL_TABLET | Freq: Two times a day (BID) | ORAL | Status: DC

## 2014-04-20 ENCOUNTER — Telehealth: Payer: Self-pay | Admitting: Internal Medicine

## 2014-04-20 ENCOUNTER — Telehealth: Payer: Self-pay | Admitting: Obstetrics & Gynecology

## 2014-04-20 NOTE — Telephone Encounter (Signed)
I sent her an email yesterday via MyChart and the [facility]:  "Dear Macie Burows,  Your urine test was "borderline". I'll email an antibiotic for you just in case... Feel better!  Sincerely,  Walker Kehr, MD "   Thx

## 2014-04-20 NOTE — Telephone Encounter (Signed)
Spoke with patient. Patient states that since last Sunday she has been experiencing lower back pain. Denies any pain with urination. Has increased urinary frequency. Is having difficulty standing and walking due to back pain. States pain is moderate. "I had chills on Monday. I started drinking cranberry juice." Patient denies chills or fever at this time. Patient was seen yesterday with PCP to have urine checked. Patient would like to know if I can see results in EPIC. Advised results are in and should contact PCP office as provider sent her a mychart message with results. Patient would like to know if a culture was done. Advised I do not see a urine culture. Patient is requesting that I call PCP and request a urine culture be performed. Advised patient can be seen in our office for evaluation and request urine culture if she would like or may contact PCP to see if they are able to add a culture and what their recommendations are for her. Patient is requesting appointment at our office between 12-1pm. Offered appointment at 11:15am and 2:15pm but patient declines. Patient will contact PCP office regarding results and will return call if she would like to be seen for evaluation at our office.  Routing to provider for final review. Patient agreeable to disposition. Will close encounter

## 2014-04-20 NOTE — Telephone Encounter (Signed)
Notified pt with md response.../lmb 

## 2014-04-20 NOTE — Telephone Encounter (Signed)
Pt called in and said that she has urine test done yesterday and wants to know what was found and if it will be cultured?  She said that she is having more pain on right side?  Please advise.     Best number is her work number

## 2014-04-20 NOTE — Telephone Encounter (Signed)
Pt states for a few days shes been having right sided pain groin area to back - feels need to urniate constantly but feels different from bladder infection. States feels difficulty standing/walking especially with right leg. Moderate pain, but not severe.  Wants to lay down all the time.  May call cell phone but would have better availability at [place]  bf

## 2014-04-25 ENCOUNTER — Telehealth: Payer: Self-pay

## 2014-04-25 ENCOUNTER — Other Ambulatory Visit: Payer: Self-pay

## 2014-04-25 MED ORDER — FLUCONAZOLE 150 MG PO TABS: 150.0000 mg | ORAL_TABLET | Freq: Once | ORAL | Status: DC

## 2014-04-25 NOTE — Telephone Encounter (Signed)
LVM for pt informing that erx for yeast infection medication has been sent to pharmacy on file.

## 2014-04-25 NOTE — Telephone Encounter (Signed)
LVM on  triage asking for something for a yeast infection. Pt stated that abx have been completed.

## 2014-05-09 ENCOUNTER — Telehealth: Payer: Self-pay | Admitting: Obstetrics & Gynecology

## 2014-05-09 NOTE — Telephone Encounter (Signed)
Called pt to reschedule her appointment with Dr Sabra Heck on 1/8. Pt was not happy and didn't wish to reschedule. She said she was at work and didn't have time for me to check the schedule and said someone needs to call her with an appointment and that she was not waiting until 2017.  Cancelled her appointment and notified Starla to add her to cancellation list.

## 2014-05-19 ENCOUNTER — Ambulatory Visit: Payer: No Typology Code available for payment source | Admitting: Obstetrics & Gynecology

## 2014-06-05 ENCOUNTER — Ambulatory Visit (INDEPENDENT_AMBULATORY_CARE_PROVIDER_SITE_OTHER): Payer: PRIVATE HEALTH INSURANCE | Admitting: Internal Medicine

## 2014-06-05 ENCOUNTER — Telehealth: Payer: Self-pay | Admitting: Internal Medicine

## 2014-06-05 ENCOUNTER — Encounter: Payer: Self-pay | Admitting: Internal Medicine

## 2014-06-05 VITALS — BP 170/110 | HR 108 | Temp 98.5°F | Wt 182.0 lb

## 2014-06-05 DIAGNOSIS — I10 Essential (primary) hypertension: Secondary | ICD-10-CM

## 2014-06-05 MED ORDER — TRIAMTERENE-HCTZ 37.5-25 MG PO TABS: 1.0000 | ORAL_TABLET | Freq: Every day | ORAL | Status: DC

## 2014-06-05 MED ORDER — CANDESARTAN CILEXETIL 32 MG PO TABS: 32.0000 mg | ORAL_TABLET | Freq: Every day | ORAL | Status: DC

## 2014-06-05 NOTE — Progress Notes (Signed)
Pre visit review using our clinic review tool, if applicable. No additional management support is needed unless otherwise documented below in the visit note. 

## 2014-06-05 NOTE — Progress Notes (Signed)
   Subjective:    Patient ID: Sarah Waller, female    DOB: 1960-10-19, 54 y.o.   MRN: [id number]  HPI  C/o HTN. Benicar is too $$$ Re-checked BP 140/85   Review of Systems  Constitutional: Negative for chills, activity change, appetite change, fatigue and unexpected weight change.  HENT: Negative for congestion, mouth sores and sinus pressure.   Eyes: Negative for visual disturbance.  Respiratory: Negative for cough and chest tightness.   Gastrointestinal: Negative for nausea and abdominal pain.  Genitourinary: Negative for frequency, difficulty urinating and vaginal pain.  Musculoskeletal: Negative for back pain and gait problem.  Skin: Negative for pallor and rash.  Neurological: Negative for dizziness, tremors, weakness, numbness and headaches.  Psychiatric/Behavioral: Negative for confusion and sleep disturbance.    BP Readings from Last 3 Encounters:  06/05/14 170/110  08/25/13 136/90  05/30/13 148/110   Wt Readings from Last 3 Encounters:  06/05/14 182 lb (82.555 kg)  08/25/13 177 lb (80.287 kg)  05/30/13 184 lb 1.9 oz (83.516 kg)       Objective:   Physical Exam  Constitutional: She appears well-developed. No distress.  HENT:  Head: Normocephalic.  Right Ear: External ear normal.  Left Ear: External ear normal.  Nose: Nose normal.  Mouth/Throat: Oropharynx is clear and moist.  Eyes: Conjunctivae are normal. Pupils are equal, round, and reactive to light. Right eye exhibits no discharge. Left eye exhibits no discharge.  Neck: Normal range of motion. Neck supple. No JVD present. No tracheal deviation present. No thyromegaly present.  Cardiovascular: Normal rate, regular rhythm and normal heart sounds.   Pulmonary/Chest: No stridor. No respiratory distress. She has no wheezes.  Abdominal: Soft. Bowel sounds are normal. She exhibits no distension and no mass. There is no tenderness. There is no rebound and no guarding.  Musculoskeletal: She exhibits no edema or  tenderness.  Lymphadenopathy:    She has no cervical adenopathy.  Neurological: She displays normal reflexes. No cranial nerve deficit. She exhibits normal muscle tone. Coordination normal.  Skin: No rash noted. No erythema.  Psychiatric: She has a normal mood and affect. Her behavior is normal. Judgment and thought content normal.          Assessment & Plan:

## 2014-06-05 NOTE — Telephone Encounter (Signed)
Pt came by office and this is what she is requesting.    Benicar 40mg   Qty 112 Refill 2

## 2014-06-05 NOTE — Assessment & Plan Note (Signed)
Pt is taking Atacand - tolerating ok Added Maxzide 1 a day

## 2014-06-05 NOTE — Telephone Encounter (Signed)
Pt called in said that she wants to pick up paper copy of Benicar .  She needs a 90 day supply.  She said that she was stopping back by today told her may not be ready.  She said that she would stop anyway.

## 2014-06-06 ENCOUNTER — Telehealth: Payer: Self-pay | Admitting: Internal Medicine

## 2014-06-06 NOTE — Telephone Encounter (Signed)
emmi mailed  °

## 2014-06-06 NOTE — Telephone Encounter (Signed)
Benicar is not on her med list. Please advise.

## 2014-06-07 MED ORDER — OLMESARTAN MEDOXOMIL 40 MG PO TABS: 40.0000 mg | ORAL_TABLET | Freq: Every day | ORAL | Status: DC

## 2014-06-07 NOTE — Telephone Encounter (Signed)
Ok Thx 

## 2014-06-07 NOTE — Telephone Encounter (Signed)
Notified pt rx ready for pick-up.../lmb 

## 2014-08-17 ENCOUNTER — Telehealth: Payer: Self-pay | Admitting: Internal Medicine

## 2014-08-17 MED ORDER — BUTALBITAL-APAP-CAFFEINE 50-325-40 MG PO TABS: 1.0000 | ORAL_TABLET | Freq: Two times a day (BID) | ORAL | Status: DC | PRN

## 2014-08-17 NOTE — Telephone Encounter (Signed)
Needs Fioricet Thx

## 2014-09-20 ENCOUNTER — Telehealth: Payer: Self-pay | Admitting: Internal Medicine

## 2014-09-20 NOTE — Telephone Encounter (Signed)
Pt request written rx for candesartan (ATACAND) 32 MG tablet, [place] and she will take this with her (less expensive). Please call pt, advise pt that Dr. Camila Li is out of the office this week (she was wondering if other doctor can okey this rx.)

## 2014-09-21 MED ORDER — CANDESARTAN CILEXETIL 32 MG PO TABS
32.0000 mg | ORAL_TABLET | Freq: Every day | ORAL | Status: DC
Start: 2014-09-21 — End: 2015-03-20

## 2014-09-21 NOTE — Telephone Encounter (Signed)
Rx printed and signed by Dr. Doug Sou and upfront for p/u. Pt informed

## 2014-09-27 ENCOUNTER — Encounter: Payer: Self-pay | Admitting: Internal Medicine

## 2014-11-06 ENCOUNTER — Telehealth: Payer: Self-pay | Admitting: Obstetrics & Gynecology

## 2014-11-06 NOTE — Telephone Encounter (Signed)
Patient is wants to speak with the nurse about her appointment for 07/0/1/16. She has an morning appointment and wants to come in later in the day. Pt. Is aware there is no available appointments on the same day. Patient states we canceled her appointment and now she would like to have this done for her.

## 2014-11-06 NOTE — Telephone Encounter (Signed)
Return call to patient. Left message to call back. Left message that can give her appointment later in the morning but no afternoon appointment available. Happy to help find another day that is suitable for her if this day doe not work. Left message to call back and ask for [facility].

## 2014-11-08 NOTE — Telephone Encounter (Signed)
Follow-up call to patient. Left message to call back.  

## 2014-11-08 NOTE — Telephone Encounter (Signed)
Patient returned call. 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 Patient requested afternoon appointment which is not available. Appointment rescheduled to 12-08-14 at 1000 per patient request.  Routing to provider for final review. Patient agreeable to disposition. Will close encounter.

## 2014-11-10 ENCOUNTER — Ambulatory Visit: Payer: No Typology Code available for payment source | Admitting: Obstetrics & Gynecology

## 2014-11-21 ENCOUNTER — Encounter: Payer: Self-pay | Admitting: Genetic Counselor

## 2014-12-08 ENCOUNTER — Encounter: Payer: Self-pay | Admitting: Obstetrics & Gynecology

## 2014-12-08 ENCOUNTER — Ambulatory Visit (INDEPENDENT_AMBULATORY_CARE_PROVIDER_SITE_OTHER): Payer: Self-pay | Admitting: Obstetrics & Gynecology

## 2014-12-08 VITALS — BP 128/82 | HR 68 | Resp 16 | Ht 65.25 in | Wt 175.0 lb

## 2014-12-08 DIAGNOSIS — Z01419 Encounter for gynecological examination (general) (routine) without abnormal findings: Secondary | ICD-10-CM

## 2014-12-08 DIAGNOSIS — Z124 Encounter for screening for malignant neoplasm of cervix: Secondary | ICD-10-CM

## 2014-12-08 MED ORDER — VITAMIN D (ERGOCALCIFEROL) 1.25 MG (50000 UNIT) PO CAPS: 50000.0000 [IU] | ORAL_CAPSULE | ORAL | Status: DC

## 2014-12-08 NOTE — Progress Notes (Signed)
54 y.o. G4P1 MarriedCaucasianF here for annual exam.  Doing well.  Denies vaginal bleeding during the past year.  Patient does not want labs lab work done.    Patient's last menstrual period was 08/10/2013.          Sexually active: No.  The current method of family planning is post menopausal status.    Exercising: Yes.     Smoker:  no  Health Maintenance: Pap:  08/20/12 WNL History of abnormal Pap:  no MMG:  09/11/14 3D-BiRads 1-normal Colonoscopy:  2010-repeat in 10 years Dr Olevia Perches BMD:   4/14 TDaP:  12/14 Screening Labs: declined, Hb today: declined, Urine today: declined   reports that she has never smoked. She has never used smokeless tobacco. She reports that she does not drink alcohol or use illicit drugs.  Past Medical History  Diagnosis Date  . Painful respiration   . Nonspecific abnormal electrocardiogram (ECG) (EKG)   . Pain in limb   . Pain in joint, shoulder region   . Headache(784.0)   . Migraine without aura, with intractable migraine, so stated, without mention of status migrainosus   . Cystitis, unspecified   . Lumbago   . Paroxysmal tachycardia, unspecified   . Irritable bowel syndrome   . Vomiting alone   . Flatulence, eructation, and gas pain   . Personal history of contact with and (suspected) exposure to potentially hazardous body fluids   . Personal history of malignant neoplasm of breast   . Rash and other nonspecific skin eruption   . Other specified disorder of rectum and anus   . Peptic ulcer, unspecified site, unspecified as acute or chronic, without mention of hemorrhage, perforation, or obstruction   . Diaphragmatic hernia without mention of obstruction or gangrene   . Allergic rhinitis, cause unspecified   . Generalized hyperhidrosis   . Malignant neoplasm of breast (female), unspecified site   . Dizziness and giddiness   . Abnormal weight gain   . Cervicalgia   . Esophageal reflux   . Unspecified asthma(493.90)   . Depression     Past  Surgical History  Procedure Laterality Date  . Breast lumpectomy    . Tumor removal      subcutaneous of right thigh  . Hysteroscopy w/d&c    . Teeth implant  7/09    2 teeth  . Left leg tumor removal  2002    benign  . Lipoma excision  2002    left shoulder    Current Outpatient Prescriptions  Medication Sig Dispense Refill  . candesartan (ATACAND) 32 MG tablet Take 1 tablet (32 mg total) by mouth daily. 30 tablet 2  . diclofenac sodium (VOLTAREN) 1 % GEL Apply 4 g topically 4 (four) times daily. 200 g 3  . triamterene-hydrochlorothiazide (MAXZIDE-25) 37.5-25 MG per tablet Take 1 tablet by mouth daily. 30 tablet 11  . Vitamin D, Ergocalciferol, (DRISDOL) 50000 UNITS CAPS capsule Take 50,000 Units by mouth every 14 (fourteen) days.    . butalbital-acetaminophen-caffeine (FIORICET) 50-325-40 MG per tablet Take 1-2 tablets by mouth 2 (two) times daily as needed for headache. (Patient not taking: Reported on 12/08/2014) 60 tablet 1  . EPINEPHrine (EPIPEN 2-PAK) 0.3 mg/0.3 mL DEVI Inject 0.3 mg into the muscle once.      . fluconazole (DIFLUCAN) 150 MG tablet Take 1 tablet (150 mg total) by mouth once. (Patient not taking: Reported on 12/08/2014) 1 tablet 0  . omeprazole-sodium bicarbonate (ZEGERID) 40-1100 MG per capsule Take 1 capsule by  mouth daily before breakfast. (Patient not taking: Reported on 12/08/2014) 90 capsule 2  . ondansetron (ZOFRAN ODT) 4 MG disintegrating tablet Take 1 tablet (4 mg total) by mouth every 8 (eight) hours as needed for nausea. (Patient not taking: Reported on 12/08/2014) 20 tablet 0  . zolmitriptan (ZOMIG) 5 MG nasal solution Place 1 spray into the nose as needed for migraine. (Patient not taking: Reported on 12/08/2014) 6 Units 5   Current Facility-Administered Medications  Medication Dose Route Frequency Provider Last Rate Last Dose  . methylPREDNISolone acetate (DEPO-MEDROL) injection 80 mg  80 mg Intra-articular Once Cassandria Anger, MD        Family  History  Problem Relation Age of Onset  . Colon cancer Neg Hx   . Hypertension Other   . COPD Mother   . Diabetes Mother   . Heart disease Mother   . Hypertension Mother     ROS:  Pertinent items are noted in HPI.  Otherwise, a comprehensive ROS was negative.  Exam:   BP 128/82 mmHg  Pulse 68  Resp 16  Ht 5' 5.25" (1.657 m)  Wt 175 lb (79.379 kg)  BMI 28.91 kg/m2  LMP 08/10/2013  Weight change: @WEIGHTCHANGE @ Height:   Height: 5' 5.25" (165.7 cm)  Ht Readings from Last 3 Encounters:  12/08/14 5' 5.25" (1.657 m)  08/25/13 5\' 5"  (1.651 m)  05/17/13 5\' 5"  (1.651 m)    General appearance: alert, cooperative and appears stated age Head: Normocephalic, without obvious abnormality, atraumatic Neck: no adenopathy, supple, symmetrical, trachea midline and thyroid normal to inspection and palpation Lungs: clear to auscultation bilaterally Breasts: well healed scars left breast with radiation changes, no masses, no LAD, no new skin changes Heart: regular rate and rhythm Abdomen: soft, non-tender; bowel sounds normal; no masses,  no organomegaly Extremities: extremities normal, atraumatic, no cyanosis or edema Skin: Skin color, texture, turgor normal. No rashes or lesions Lymph nodes: Cervical, supraclavicular, and axillary nodes normal. No abnormal inguinal nodes palpated Neurologic: Grossly normal   Pelvic: External genitalia:  no lesions              Urethra:  normal appearing urethra with no masses, tenderness or lesions              Bartholins and Skenes: normal                 Vagina: normal appearing vagina with normal color and discharge, no lesions, atrophic changes              Cervix: no lesions              Pap taken: Yes.   Bimanual Exam:  Uterus:  normal size, contour, position, consistency, mobility, non-tender              Adnexa: normal adnexa and no mass, fullness, tenderness               Rectovaginal: Confirms               Anus:  normal sphincter tone, no  lesions  Chaperone was present for exam.  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  Stopped Tamoxifen after 7 years.  Has not seen oncologist (Dr. Marin Olp) in two years and doesn't desire to have follow-up H/O mild anemia H/O migraines H/O depression Ashkenazic Jewish heritage  P: Mammogram yearly. Every other year MRI have been recommended pap smear with  neg HR HPV 3/13. Pap only 2014.  Pap today  Labs with Dr. Alain Marion return annually or prn

## 2014-12-12 LAB — IPS PAP TEST WITH REFLEX TO HPV

## 2014-12-19 ENCOUNTER — Telehealth: Payer: Self-pay | Admitting: *Deleted

## 2014-12-19 ENCOUNTER — Other Ambulatory Visit: Payer: Self-pay | Admitting: *Deleted

## 2014-12-19 MED ORDER — ALBUTEROL SULFATE HFA 108 (90 BASE) MCG/ACT IN AERS: 2.0000 | INHALATION_SPRAY | Freq: Four times a day (QID) | RESPIRATORY_TRACT | Status: DC | PRN

## 2014-12-19 NOTE — Telephone Encounter (Signed)
Left msg on triage stating albuterol inhaler has expired. Requesting nre rx sent to [facility]. Called pt back no answer LMOM rx sent to [facility]...Sarah Waller

## 2015-03-12 ENCOUNTER — Telehealth: Payer: Self-pay | Admitting: Internal Medicine

## 2015-03-12 ENCOUNTER — Other Ambulatory Visit: Payer: Self-pay

## 2015-03-12 DIAGNOSIS — Z7721 Contact with and (suspected) exposure to potentially hazardous body fluids: Secondary | ICD-10-CM

## 2015-03-12 LAB — HIV ANTIBODY (ROUTINE TESTING W REFLEX): HIV: NONREACTIVE

## 2015-03-12 LAB — HEPATITIS PANEL, ACUTE
HCV Ab: NEGATIVE
Hep A IgM: NONREACTIVE
Hep B C IgM: NONREACTIVE
Hepatitis B Surface Ag: NEGATIVE

## 2015-03-12 NOTE — Telephone Encounter (Signed)
Patient is at work and stuck herself with a used needle accidentally. She is requesting for lab orders to be placed to take STD/ other comm diseases. Please give the patient a call ASAP to advise.

## 2015-03-12 NOTE — Telephone Encounter (Signed)
Labs entered. Pt informed by Alvira Monday., [facility] scheduler.

## 2015-03-14 ENCOUNTER — Encounter: Payer: Self-pay | Admitting: Internal Medicine

## 2015-03-20 ENCOUNTER — Telehealth: Payer: Self-pay | Admitting: *Deleted

## 2015-03-20 MED ORDER — CANDESARTAN CILEXETIL 32 MG PO TABS: 32.0000 mg | ORAL_TABLET | Freq: Every day | ORAL | Status: DC

## 2015-03-20 NOTE — Telephone Encounter (Signed)
Left msg on triage needing to pick=up rx for bp med. Need to mail rx to [place]. Notified pt rx ready for pick-up....Sarah Waller

## 2015-04-02 ENCOUNTER — Ambulatory Visit (INDEPENDENT_AMBULATORY_CARE_PROVIDER_SITE_OTHER): Payer: PRIVATE HEALTH INSURANCE | Admitting: Internal Medicine

## 2015-04-02 ENCOUNTER — Other Ambulatory Visit (INDEPENDENT_AMBULATORY_CARE_PROVIDER_SITE_OTHER): Payer: PRIVATE HEALTH INSURANCE

## 2015-04-02 ENCOUNTER — Encounter: Payer: Self-pay | Admitting: Internal Medicine

## 2015-04-02 VITALS — BP 108/70 | HR 81 | Temp 98.7°F | Wt 178.0 lb

## 2015-04-02 DIAGNOSIS — N10 Acute tubulo-interstitial nephritis: Secondary | ICD-10-CM

## 2015-04-02 DIAGNOSIS — I10 Essential (primary) hypertension: Secondary | ICD-10-CM

## 2015-04-02 DIAGNOSIS — R300 Dysuria: Secondary | ICD-10-CM

## 2015-04-02 DIAGNOSIS — N390 Urinary tract infection, site not specified: Secondary | ICD-10-CM | POA: Insufficient documentation

## 2015-04-02 LAB — URINALYSIS
Bilirubin Urine: NEGATIVE
Hgb urine dipstick: NEGATIVE
Ketones, ur: NEGATIVE
Leukocytes, UA: NEGATIVE
NITRITE: NEGATIVE
SPECIFIC GRAVITY, URINE: 1.02 (ref 1.000–1.030)
Total Protein, Urine: NEGATIVE
URINE GLUCOSE: NEGATIVE
UROBILINOGEN UA: 0.2 (ref 0.0–1.0)
pH: 6 (ref 5.0–8.0)

## 2015-04-02 LAB — SEDIMENTATION RATE: Sed Rate: 14 mm/hr (ref 0–22)

## 2015-04-02 MED ORDER — CIPROFLOXACIN HCL 500 MG PO TABS: 500.0000 mg | ORAL_TABLET | Freq: Two times a day (BID) | ORAL | Status: DC

## 2015-04-02 MED ORDER — PHENAZOPYRIDINE HCL 100 MG PO TABS: 100.0000 mg | ORAL_TABLET | Freq: Three times a day (TID) | ORAL | Status: DC | PRN

## 2015-04-02 NOTE — Progress Notes (Signed)
Pre visit review using our clinic review tool, if applicable. No additional management support is needed unless otherwise documented below in the visit note. 

## 2015-04-02 NOTE — Progress Notes (Signed)
Subjective:  Patient ID: Sarah Waller, female    DOB: 1961-04-30  Age: 54 y.o. MRN: [id number]  CC: Urinary Frequency and Vaginal Itching   HPI Sarah Waller presents for urinary urgency, burning 1 week- took Bactrim x 5 d; not better   Outpatient Prescriptions Prior to Visit  Medication Sig Dispense Refill  . albuterol (PROAIR HFA) 108 (90 BASE) MCG/ACT inhaler Inhale 2 puffs into the lungs every 6 (six) hours as needed. 18 g 1  . butalbital-acetaminophen-caffeine (FIORICET) 50-325-40 MG per tablet Take 1-2 tablets by mouth 2 (two) times daily as needed for headache. 60 tablet 1  . candesartan (ATACAND) 32 MG tablet Take 1 tablet (32 mg total) by mouth daily. 90 tablet 3  . diclofenac sodium (VOLTAREN) 1 % GEL Apply 4 g topically 4 (four) times daily. 200 g 3  . EPINEPHrine (EPIPEN 2-PAK) 0.3 mg/0.3 mL DEVI Inject 0.3 mg into the muscle once.      . fluconazole (DIFLUCAN) 150 MG tablet Take 1 tablet (150 mg total) by mouth once. 1 tablet 0  . omeprazole-sodium bicarbonate (ZEGERID) 40-1100 MG per capsule Take 1 capsule by mouth daily before breakfast. 90 capsule 2  . ondansetron (ZOFRAN ODT) 4 MG disintegrating tablet Take 1 tablet (4 mg total) by mouth every 8 (eight) hours as needed for nausea. 20 tablet 0  . triamterene-hydrochlorothiazide (MAXZIDE-25) 37.5-25 MG per tablet Take 1 tablet by mouth daily. 30 tablet 11  . Vitamin D, Ergocalciferol, (DRISDOL) 50000 UNITS CAPS capsule Take 1 capsule (50,000 Units total) by mouth every 14 (fourteen) days. 30 capsule 1  . zolmitriptan (ZOMIG) 5 MG nasal solution Place 1 spray into the nose as needed for migraine. 6 Units 5   Facility-Administered Medications Prior to Visit  Medication Dose Route Frequency Provider Last Rate Last Dose  . methylPREDNISolone acetate (DEPO-MEDROL) injection 80 mg  80 mg Intra-articular Once Aleksei Plotnikov V, MD        ROS Review of Systems  Constitutional: Negative for chills, activity change,  appetite change, fatigue and unexpected weight change.  HENT: Negative for congestion, mouth sores and sinus pressure.   Eyes: Negative for visual disturbance.  Respiratory: Negative for cough and chest tightness.   Gastrointestinal: Negative for nausea and abdominal pain.  Genitourinary: Negative for frequency, difficulty urinating and vaginal pain.  Musculoskeletal: Negative for back pain and gait problem.  Skin: Negative for pallor and rash.  Neurological: Negative for dizziness, tremors, weakness, numbness and headaches.  Psychiatric/Behavioral: Negative for suicidal ideas, confusion and sleep disturbance.    Objective:  BP 108/70 mmHg  Pulse 81  Temp(Src) 98.7 F (37.1 C) (Oral)  Wt 178 lb (80.74 kg)  SpO2 96%  LMP 08/10/2013  BP Readings from Last 3 Encounters:  04/02/15 108/70  12/08/14 128/82  06/05/14 170/110    Wt Readings from Last 3 Encounters:  04/02/15 178 lb (80.74 kg)  12/08/14 175 lb (79.379 kg)  06/05/14 182 lb (82.555 kg)    Physical Exam  Constitutional: She appears well-developed. No distress.  HENT:  Head: Normocephalic.  Right Ear: External ear normal.  Left Ear: External ear normal.  Nose: Nose normal.  Mouth/Throat: Oropharynx is clear and moist.  Eyes: Conjunctivae are normal. Pupils are equal, round, and reactive to light. Right eye exhibits no discharge. Left eye exhibits no discharge.  Neck: Normal range of motion. Neck supple. No JVD present. No tracheal deviation present. No thyromegaly present.  Cardiovascular: Normal rate, regular rhythm and normal heart sounds.  Pulmonary/Chest: No stridor. No respiratory distress. She has no wheezes.  Abdominal: Soft. Bowel sounds are normal. She exhibits no distension and no mass. There is no tenderness. There is no rebound and no guarding.  Musculoskeletal: She exhibits no edema or tenderness.  Lymphadenopathy:    She has no cervical adenopathy.  Neurological: She displays normal reflexes. No  cranial nerve deficit. She exhibits normal muscle tone. Coordination normal.  Skin: No rash noted. No erythema.  Psychiatric: She has a normal mood and affect. Her behavior is normal. Judgment and thought content normal.  RLQ is sensitive  Lab Results  Component Value Date   WBC 4.4* 05/10/2013   HGB 12.4 05/10/2013   HCT 37.0 05/10/2013   PLT 240.0 05/10/2013   GLUCOSE 95 05/10/2013   CHOL 251* 06/04/2012   TRIG 114.0 06/04/2012   HDL 52.80 06/04/2012   LDLDIRECT 157.3 06/04/2012   ALT 36* 05/10/2013   AST 28 05/10/2013   NA 141 05/10/2013   K 4.2 05/10/2013   CL 105 05/10/2013   CREATININE 0.7 05/10/2013   BUN 14 05/10/2013   CO2 30 05/10/2013   TSH 1.34 03/17/2013   INR 0.9 ratio 09/05/2009    No results found.  Assessment & Plan:   Sarah Waller was seen today for urinary frequency and vaginal itching.  Diagnoses and all orders for this visit:  Dysuria -     Sedimentation rate; Future -     CBC with Differential/Platelet; Future -     Urinalysis; Future -     CULTURE, URINE COMPREHENSIVE; Future  Other orders -     ciprofloxacin (CIPRO) 500 MG tablet; Take 1 tablet (500 mg total) by mouth 2 (two) times daily. -     phenazopyridine (PYRIDIUM) 100 MG tablet; Take 1-2 tablets (100-200 mg total) by mouth 3 (three) times daily as needed for pain.   I am having Sarah Waller start on ciprofloxacin and phenazopyridine. I am also having her maintain her EPINEPHrine, diclofenac sodium, omeprazole-sodium bicarbonate, zolmitriptan, ondansetron, fluconazole, triamterene-hydrochlorothiazide, butalbital-acetaminophen-caffeine, Vitamin D (Ergocalciferol), albuterol, and candesartan. We will continue to administer methylPREDNISolone acetate.  Meds ordered this encounter  Medications  . ciprofloxacin (CIPRO) 500 MG tablet    Sig: Take 1 tablet (500 mg total) by mouth 2 (two) times daily.    Dispense:  20 tablet    Refill:  0  . phenazopyridine (PYRIDIUM) 100 MG tablet    Sig: Take  1-2 tablets (100-200 mg total) by mouth 3 (three) times daily as needed for pain.    Dispense:  30 tablet    Refill:  1     Follow-up: No Follow-up on file.  Walker Kehr, MD

## 2015-04-02 NOTE — Assessment & Plan Note (Signed)
On Maxzide

## 2015-04-02 NOTE — Assessment & Plan Note (Addendum)
UA, Cx Cipro x 10 d Pyridium prn Abd CT or [place] if needed

## 2015-04-03 LAB — CBC WITH DIFFERENTIAL/PLATELET
BASOS PCT: 0.3 % (ref 0.0–3.0)
Basophils Absolute: 0 10*3/uL (ref 0.0–0.1)
EOS ABS: 0.1 10*3/uL (ref 0.0–0.7)
EOS PCT: 0.9 % (ref 0.0–5.0)
HEMATOCRIT: 40.9 % (ref 36.0–46.0)
HEMOGLOBIN: 13.4 g/dL (ref 12.0–15.0)
LYMPHS PCT: 24.9 % (ref 12.0–46.0)
Lymphs Abs: 1.4 10*3/uL (ref 0.7–4.0)
MCHC: 32.7 g/dL (ref 30.0–36.0)
MCV: 89.1 fl (ref 78.0–100.0)
Monocytes Absolute: 0.4 10*3/uL (ref 0.1–1.0)
Monocytes Relative: 6.9 % (ref 3.0–12.0)
Neutro Abs: 3.8 10*3/uL (ref 1.4–7.7)
Neutrophils Relative %: 67 % (ref 43.0–77.0)
Platelets: 223 10*3/uL (ref 150.0–400.0)
RBC: 4.59 Mil/uL (ref 3.87–5.11)
RDW: 13.8 % (ref 11.5–15.5)
WBC: 5.7 10*3/uL (ref 4.0–10.5)

## 2015-04-03 LAB — CULTURE, URINE COMPREHENSIVE

## 2015-04-11 ENCOUNTER — Telehealth: Payer: Self-pay | Admitting: Obstetrics & Gynecology

## 2015-04-11 NOTE — Telephone Encounter (Signed)
Patient was seen at her primary doctor's office about 2 weeks ago for right side pain and she is still having pain. She would like to see Dr. Sabra Heck and speak with the nurse also. 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 Please call her at work [phone] and tell them you are calling from her doctor's office. Patient states this is an emergency.

## 2015-04-11 NOTE — Telephone Encounter (Signed)
Spoke with patient. Patient states that she has been experiencing right sided discomfort for two weeks. Was seen with her PCP who did blood work and urine culture all which returned normal per patient. Patient states that the discomfort has persisted, but has not worsened. Feels increased discomfort with urination in lower back and burning after urination. Denies any fevers or chills. "I feel like my pelvis is swollen. I know it is not but it is uncomfortable." Advised she will need to be seen in the office for further evaluation. Offered appointment today with another MD as Dr.Miller is out of the office today. Patient declines as she only wants to see Dr.Miller. Offered appointment tomorrow at 10 am with Dr.Miller but patient declines due to work. Appointment scheduled for Friday 12/2 at 10:15 am with Dr.Miller. Agreeable to date and time. Advised if symptoms worsen or develops new symptoms will need to be seen in the office sooner. If changes occur over night will need to seek care at a local ER. Patient is agreeable.  Routing to provider for final review. Patient agreeable to disposition. Will close encounter.

## 2015-04-13 ENCOUNTER — Ambulatory Visit (INDEPENDENT_AMBULATORY_CARE_PROVIDER_SITE_OTHER): Payer: PRIVATE HEALTH INSURANCE | Admitting: Obstetrics & Gynecology

## 2015-04-13 ENCOUNTER — Telehealth: Payer: Self-pay | Admitting: Obstetrics & Gynecology

## 2015-04-13 ENCOUNTER — Encounter: Payer: Self-pay | Admitting: Obstetrics & Gynecology

## 2015-04-13 VITALS — BP 122/84 | HR 72 | Temp 97.9°F | Resp 16 | Ht 65.25 in | Wt 181.0 lb

## 2015-04-13 DIAGNOSIS — R1031 Right lower quadrant pain: Secondary | ICD-10-CM | POA: Diagnosis not present

## 2015-04-13 DIAGNOSIS — R300 Dysuria: Secondary | ICD-10-CM | POA: Diagnosis not present

## 2015-04-13 DIAGNOSIS — Z853 Personal history of malignant neoplasm of breast: Secondary | ICD-10-CM | POA: Diagnosis not present

## 2015-04-13 LAB — POCT URINALYSIS DIPSTICK
BILIRUBIN UA: NEGATIVE
Blood, UA: NEGATIVE
Glucose, UA: NEGATIVE
Ketones, UA: NEGATIVE
NITRITE UA: NEGATIVE
PH UA: 5
PROTEIN UA: NEGATIVE
Urobilinogen, UA: NEGATIVE

## 2015-04-13 MED ORDER — TERCONAZOLE 0.4 % VA CREA: 1.0000 | TOPICAL_CREAM | Freq: Every day | VAGINAL | Status: DC

## 2015-04-13 NOTE — Telephone Encounter (Signed)
Pharmacist from [facility] in regards to patient she said that she was seen today and was going to have 2 medications sent in. The Terazol cream was the only rx sent in, patient is looking for a pill. [facility] on [address]

## 2015-04-13 NOTE — Telephone Encounter (Signed)
Patient scheduled for pelvic ultrasound 04/17/15. Reviewed benefit with patient for pelvic ultrasound. 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 Patient agreeable to return call on Monday after discussing with billing and insurance. Patient also requests to discuss with Dr Sabra Heck regarding recommendation of testing now versus in the new year based on concerns. Patient agreeable to return call Monday.

## 2015-04-13 NOTE — Progress Notes (Signed)
Subjective:     Patient ID: Sarah Waller, female   DOB: 1961/04/02, 54 y.o.   MRN: [id number]  HPI 11 you G4P1 MWF here for two weeks complaint of RLQ pain with some pelvic burning sensation.  She describes this as a feeling that is "inside".  She is having increased bloating.  She is having increased back pain as well.  She is having increased frequency.  Denies blood in her urine.  Reports vaginal itching as well.    The pain and pressure has improved some in the last two days.  Denies vaginal bleeding.    Took trimethoprim for 5 days about two weeks ago.  Reports stools are a little softer, maybe twice daily.  Did have some nausea for a few days when this started and she did have emesis.    U/A and culture and CBC was normal with Dr. Alain Marion 04/02/15.  Review of Systems  Genitourinary: Positive for pelvic pain.  All other systems reviewed and are negative.      Objective:   Physical Exam  Constitutional: She is oriented to person, place, and time. She appears well-developed and well-nourished.  Abdominal: Soft. Bowel sounds are normal. She exhibits no distension and no mass. There is no tenderness. There is no rebound and no guarding.  Genitourinary: Vagina normal and uterus normal.    There is rash on the right labia. There is no tenderness, lesion or injury on the right labia. There is rash on the left labia. There is no tenderness, lesion or injury on the left labia. Cervix exhibits no motion tenderness. Right adnexum displays no mass, no tenderness and no fullness. Left adnexum displays no mass, no tenderness and no fullness. No bleeding in the vagina. No vaginal discharge found.  Musculoskeletal: Normal range of motion.  Lymphadenopathy:       Right: No inguinal adenopathy present.       Left: No inguinal adenopathy present.  Neurological: She is alert and oriented to person, place, and time.  Skin: Skin is warm and dry.  Psychiatric: She has a normal mood and affect.    No CVA tenderness     Assessment:     RLQ pain that I cannot illicit on physical exam today Normal physical exam today except for skin erythema consistent with yeast Vaginal itching H/o breast cancer     Plan:     Terazol 7 qhs and externally nightly for 7 nights Pt will return for PUS      In excess of 30 minutes spent with pt in face to face discussion of above as well as her concerns.  Physical exam was done during this time but most of exam was spent in face to face discussion.

## 2015-04-13 NOTE — Telephone Encounter (Signed)
Spoke with Dr. Sabra Heck and confirmed, Rx for Terazol placed. Can use internally and externally. Patient notified and agreeable.  Will call back as needed. Routing to provider for final review. Patient agreeable to disposition. Will close encounter.

## 2015-04-13 NOTE — Telephone Encounter (Signed)
Routing to Perry for review and advise. Terazol 7 was sent to pharmacy today. Pharmacy calling to state that the patient was expecting 2 medications to be sent in.

## 2015-04-16 NOTE — Telephone Encounter (Signed)
Patient calling back. Ok to leave a detailed message for her if she don't answer.

## 2015-04-16 NOTE — Telephone Encounter (Signed)
Patient states she does not wish to proceed with ultrasound scheduled for 04/17/2015. She states she feels much better and if pain returns she will return to office or go to hospital.   Routing to provider for final review. Patient agreeable to disposition. Will close encounter.

## 2015-04-16 NOTE — Telephone Encounter (Signed)
Patient returning call. Says she should be able to answer her phone between 1 and 2.

## 2015-04-16 NOTE — Telephone Encounter (Signed)
Seth Bake called patient to follow up regarding concerns left voicemail for return call.

## 2015-04-16 NOTE — Telephone Encounter (Signed)
Return call to patient. Left message on voicemail for patient to return my call.

## 2015-04-17 ENCOUNTER — Other Ambulatory Visit: Payer: Self-pay | Admitting: *Deleted

## 2015-04-17 ENCOUNTER — Other Ambulatory Visit: Payer: PRIVATE HEALTH INSURANCE | Admitting: Obstetrics & Gynecology

## 2015-04-17 ENCOUNTER — Other Ambulatory Visit: Payer: PRIVATE HEALTH INSURANCE

## 2015-04-17 ENCOUNTER — Telehealth: Payer: Self-pay | Admitting: Obstetrics & Gynecology

## 2015-04-17 DIAGNOSIS — R102 Pelvic and perineal pain: Secondary | ICD-10-CM

## 2015-04-17 NOTE — Telephone Encounter (Signed)
Patient calling to see if Sarah Waller is available to speak with.

## 2015-04-17 NOTE — Telephone Encounter (Signed)
Patient is asking to talk with Sarah Waller again regarding conversation yesterday. Patient is asking that you please call her today. "I would like to get this resolved today".

## 2015-04-17 NOTE — Telephone Encounter (Signed)
Spoke with patient. Patient wishes to schedule ultrasound at [facility]. Patient aware will need appointment scheduled with Dr. Sabra Heck to review results of ultrasound. Referral sent to [facility] and patient will call them to schedule appointment.   Routing to provider for final review. Patient agreeable to disposition. Will close encounter.

## 2015-04-18 ENCOUNTER — Telehealth: Payer: Self-pay | Admitting: Obstetrics & Gynecology

## 2015-04-18 NOTE — Telephone Encounter (Signed)
Patient is returning a call to Kaitlyn. °

## 2015-04-18 NOTE — Telephone Encounter (Signed)
Please let her know a CT will, of course, see more.  If she would like to do this instead of the ultrasound, I am fine to order it.  Pt has hx of breast cancer.  Please remind her that the more we look at, the more change of false positive findings as well.  Ultrasound is better for imaging the ovaries, which is what I wanted to look at primarily.  Colon findings, other than constipation, are best seen with colonoscopy.  So, my opinion would be to just proceed with ultrasound.

## 2015-04-18 NOTE — Telephone Encounter (Signed)
Attempted to reach patient at the number provided [phone]. The line rang and rang with no answer then went to a busy tone. Will try again later.

## 2015-04-18 NOTE — Telephone Encounter (Signed)
Patient wants to possibly go to [facility] for her recommended scans. She said, "I want to know which is the best imaging for me to have there and if that is okay with Dr. Sabra Heck."   [facility] in [place]

## 2015-04-18 NOTE — Telephone Encounter (Signed)
Left message to call Kaitlyn at [phone]. 

## 2015-04-18 NOTE — Telephone Encounter (Signed)
Spoke with patient. Patient states that she has been "shopping" around for the best location to have her ultrasound. Patient would like to have her imaging performed at [facility]. Advised this can be performed at their location. Order will need to be written and signed by Dr.Miller before we can fax and this can be scheduled. Advised Dr.Miller is out of the office today. Advised will write order for signature and fax tomorrow. Offered to schedule ulrasound once order has been faxed but patient declines. Patient states that she would like to schedule this herself. Patient is asking if an ultrasound will be enough or if she needs a CT. Will be paying OOP for the imaging and does not want to have to have two tests. Advised right now only thing indicated that she needs by Dr.Miller is PUS. Patient insists I speak with Dr.Miller to see if any additional imaging is needed. Requests I return call with this information before sending an order to [facility].

## 2015-04-19 NOTE — Telephone Encounter (Signed)
Spoke with patient. Advised order for PUS has been faxed to [facility] at [phone] with cover sheet and confirmation. Patient is agreeable and will call to schedule this appointment. Will return call once PUS is scheduled to schedule a consult appointment with Dr.Miller to go over results.  Routing to provider for final review. Patient agreeable to disposition. Will close encounter.

## 2015-04-19 NOTE — Telephone Encounter (Signed)
Spoke with patient. Advised of message as seen below from [place]. Patient is agreeable and verbalizes understanding. Patient would like to proceed with PUS only. Wants to have this performed with [facility]. Advised I will write order for Dr.Miller to review and sign before faxing to their location. Patient is agreeable and would like to schedule this appointment. Advised I will notify her once the order has been placed so that she may call to schedule. Patient is agreeable.

## 2015-04-20 ENCOUNTER — Other Ambulatory Visit: Payer: PRIVATE HEALTH INSURANCE

## 2015-04-26 ENCOUNTER — Telehealth: Payer: Self-pay | Admitting: Obstetrics & Gynecology

## 2015-04-26 NOTE — Telephone Encounter (Signed)
PUS results from [facility] and paper chart to [place] for review and advise.

## 2015-04-26 NOTE — Telephone Encounter (Signed)
Spoke with patient. Advised of results and message as seen below from [place]. Patient is agreeable and verbalizes understanding. States she had one episode of "dark discharge" yesterday. Has not had any since. Advised it is okay to monitor. If discharge returns will need to be seen in office for further evaluation. Patient is agreeable.  Routing to provider for final review. Patient agreeable to disposition. Will close encounter.

## 2015-04-26 NOTE — Telephone Encounter (Addendum)
Patient calling wanting to discuss the results of her ultrasound, and patient says yesterday she noticed some discharge, wants to make sure she doesn't need to come in. Patient preferred to speak with Verline Lema since she has helped her before. Patient asked for you to call mobile first [phone] and [phone] (w) if you cannot reach her.

## 2015-04-26 NOTE — Telephone Encounter (Signed)
Report compared to prior PUS in 2014 and signed off.  Pt had, on current, PUS a 2.1cm posterior fibroid.  This was 2.5cm when we measured it and there was also a 1.5cm fibroid in 2014.  Ovaries are normal.  Endometrium normal.  If having new issues, needs OV.

## 2015-07-27 ENCOUNTER — Other Ambulatory Visit: Payer: Self-pay

## 2015-07-27 MED ORDER — TRIAMTERENE-HCTZ 37.5-25 MG PO TABS: 1.0000 | ORAL_TABLET | Freq: Every day | ORAL | Status: DC

## 2015-09-07 DIAGNOSIS — H25013 Cortical age-related cataract, bilateral: Secondary | ICD-10-CM | POA: Insufficient documentation

## 2015-09-07 DIAGNOSIS — H35033 Hypertensive retinopathy, bilateral: Secondary | ICD-10-CM | POA: Insufficient documentation

## 2015-09-12 LAB — HM MAMMOGRAPHY

## 2015-09-13 ENCOUNTER — Encounter: Payer: Self-pay | Admitting: Internal Medicine

## 2015-09-18 ENCOUNTER — Ambulatory Visit (INDEPENDENT_AMBULATORY_CARE_PROVIDER_SITE_OTHER): Payer: Self-pay | Admitting: Obstetrics & Gynecology

## 2015-09-18 ENCOUNTER — Encounter: Payer: Self-pay | Admitting: Obstetrics & Gynecology

## 2015-09-18 VITALS — BP 130/80 | HR 56 | Resp 14 | Wt 176.6 lb

## 2015-09-18 DIAGNOSIS — N644 Mastodynia: Secondary | ICD-10-CM

## 2015-09-27 NOTE — Progress Notes (Signed)
GYNECOLOGY  VISIT   HPI: 55 y.o. G4P1 Married [place] female with here with two to three week of increased pain along her scar.  Pt with hx of breast cancer, stage 1, about 9 years ago.  Pt denies recent trauma.  She cannot feel a lump or mass.  She denies skin changes or nipple discharge.  Pt just had MMG on 09/12/15 at [facility] that was normal.  This was also a 3D.    Pt has a lot of anxiety about this breast pain.  Her son is a PA and she had him examine her breast over the weekend.  She thought if he didn't feel anything, that would make her less worried but that didn't actually happened.  So, she is here for recommendations today.  GYNECOLOGIC HISTORY: Patient's last menstrual period was 08/10/2013. Contraception: PMP Menopausal hormone therapy: none  Past Medical History  Diagnosis Date  . Painful respiration   . Nonspecific abnormal electrocardiogram (ECG) (EKG)   . Pain in limb   . Pain in joint, shoulder region   . Headache(784.0)   . Migraine without aura, with intractable migraine, so stated, without mention of status migrainosus   . Cystitis, unspecified   . Lumbago   . Paroxysmal tachycardia, unspecified ([place])   . Irritable bowel syndrome   . Vomiting alone   . Flatulence, eructation, and gas pain   . Personal history of contact with and (suspected) exposure to potentially hazardous body fluids   . Personal history of malignant neoplasm of breast   . Rash and other nonspecific skin eruption   . Other specified disorder of rectum and anus   . Peptic ulcer, unspecified site, unspecified as acute or chronic, without mention of hemorrhage, perforation, or obstruction   . Diaphragmatic hernia without mention of obstruction or gangrene   . Allergic rhinitis, cause unspecified   . Generalized hyperhidrosis   . Malignant neoplasm of breast (female), unspecified site   . Dizziness and giddiness   . Abnormal weight gain   . Cervicalgia   . Esophageal reflux   .  Unspecified asthma(493.90)   . Depression     Past Surgical History  Procedure Laterality Date  . Breast lumpectomy    . Tumor removal      subcutaneous of right thigh  . Hysteroscopy w/d&c    . Teeth implant  7/09    2 teeth  . Left leg tumor removal  2002    benign  . Lipoma excision  2002    left shoulder    MEDS:  Reviewed in EPIC and UTD  ALLERGIES: Candesartan; Hydrocodone-acetaminophen; Hydrocodone-acetaminophen; Minocycline hcl; and Tetracycline  Family History  Problem Relation Age of Onset  . Colon cancer Neg Hx   . Hypertension Other   . COPD Mother   . Diabetes Mother   . Heart disease Mother   . Hypertension Mother     SH:  Married, non smoker  Review of Systems  All other systems reviewed and are negative.   PHYSICAL EXAMINATION:    BP 130/80 mmHg  Pulse 56  Resp 14  Wt 176 lb 9.6 oz (80.105 kg)  LMP 08/10/2013     Physical Exam  Vitals reviewed. Constitutional: She appears well-developed and well-nourished.  HENT:  Head: Atraumatic.  Neck: Normal range of motion. Neck supple. No thyromegaly present.  Cardiovascular: Normal rate and regular rhythm.   Respiratory: Effort normal and breath sounds normal. Right breast exhibits no inverted nipple, no mass, no  nipple discharge, no skin change and no tenderness. Left breast exhibits tenderness. Left breast exhibits no inverted nipple, no mass, no nipple discharge and no skin change. Breasts are symmetrical.    Lymphadenopathy:    She has no cervical adenopathy.       Right axillary: No pectoral and no lateral adenopathy present.       Left axillary: No pectoral and no lateral adenopathy present. Skin: Skin is warm and dry.  Psychiatric: She has a normal mood and affect.   Chaperone was present for exam.  Assessment: Breast pain that may be from underwire bra rubbing along her scar.  Pt does admit her bras feel too small due to weight gain.   Plan: Advised considering having a bra refitting  to be properly sized.  Until then, advised sports bra and anti-inflammatories.  If pain persists past another two weeks, consider repeat imaging.      '

## 2016-01-08 ENCOUNTER — Other Ambulatory Visit: Payer: Self-pay | Admitting: Internal Medicine

## 2016-01-10 NOTE — Telephone Encounter (Signed)
Done

## 2016-01-16 ENCOUNTER — Telehealth: Payer: Self-pay | Admitting: Internal Medicine

## 2016-01-16 DIAGNOSIS — Z7721 Contact with and (suspected) exposure to potentially hazardous body fluids: Secondary | ICD-10-CM

## 2016-01-16 NOTE — Telephone Encounter (Signed)
This pt called in at 5:18 Demand that blood test be put in right now because she has accident at work.  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  This was a workers comp situation and she said that it was complicated.  We advised that she needed to go to er or urgent care and she refused and continued to demand for me to put a order in to check for HIV and hep C.  She was refusing to be seen.  She said that she was going to call back in the morning at 8

## 2016-01-17 NOTE — Telephone Encounter (Signed)
Pt informed- she had a flight at 10 am today to [place] x 3 weeks. She is already in [place]. She states she went to [facility] last night.

## 2016-01-17 NOTE — Telephone Encounter (Signed)
OK HIV and Hep C test Thx

## 2016-02-19 ENCOUNTER — Telehealth: Payer: Self-pay | Admitting: *Deleted

## 2016-02-19 ENCOUNTER — Ambulatory Visit (INDEPENDENT_AMBULATORY_CARE_PROVIDER_SITE_OTHER): Payer: BLUE CROSS/BLUE SHIELD | Admitting: Internal Medicine

## 2016-02-19 ENCOUNTER — Other Ambulatory Visit (INDEPENDENT_AMBULATORY_CARE_PROVIDER_SITE_OTHER): Payer: BLUE CROSS/BLUE SHIELD

## 2016-02-19 ENCOUNTER — Encounter: Payer: Self-pay | Admitting: Internal Medicine

## 2016-02-19 VITALS — BP 120/90 | HR 82 | Ht 65.0 in | Wt 176.0 lb

## 2016-02-19 DIAGNOSIS — Z0000 Encounter for general adult medical examination without abnormal findings: Secondary | ICD-10-CM | POA: Diagnosis not present

## 2016-02-19 DIAGNOSIS — I10 Essential (primary) hypertension: Secondary | ICD-10-CM

## 2016-02-19 DIAGNOSIS — J4520 Mild intermittent asthma, uncomplicated: Secondary | ICD-10-CM

## 2016-02-19 DIAGNOSIS — Z853 Personal history of malignant neoplasm of breast: Secondary | ICD-10-CM

## 2016-02-19 DIAGNOSIS — Z7721 Contact with and (suspected) exposure to potentially hazardous body fluids: Secondary | ICD-10-CM

## 2016-02-19 LAB — URINALYSIS, ROUTINE W REFLEX MICROSCOPIC
Bilirubin Urine: NEGATIVE
Hgb urine dipstick: NEGATIVE
KETONES UR: NEGATIVE
NITRITE: NEGATIVE
SPECIFIC GRAVITY, URINE: 1.015 (ref 1.000–1.030)
Total Protein, Urine: NEGATIVE
URINE GLUCOSE: NEGATIVE
UROBILINOGEN UA: 0.2 (ref 0.0–1.0)
pH: 7 (ref 5.0–8.0)

## 2016-02-19 LAB — HEPATIC FUNCTION PANEL
ALT: 22 U/L (ref 0–35)
AST: 18 U/L (ref 0–37)
Albumin: 4.5 g/dL (ref 3.5–5.2)
Alkaline Phosphatase: 70 U/L (ref 39–117)
Bilirubin, Direct: 0 mg/dL (ref 0.0–0.3)
TOTAL PROTEIN: 7.3 g/dL (ref 6.0–8.3)
Total Bilirubin: 0.6 mg/dL (ref 0.2–1.2)

## 2016-02-19 LAB — CBC WITH DIFFERENTIAL/PLATELET
BASOS ABS: 0 10*3/uL (ref 0.0–0.1)
Basophils Relative: 0.3 % (ref 0.0–3.0)
Eosinophils Absolute: 0.1 10*3/uL (ref 0.0–0.7)
Eosinophils Relative: 1.4 % (ref 0.0–5.0)
HEMATOCRIT: 39.8 % (ref 36.0–46.0)
Hemoglobin: 13.5 g/dL (ref 12.0–15.0)
LYMPHS PCT: 29.5 % (ref 12.0–46.0)
Lymphs Abs: 1.2 10*3/uL (ref 0.7–4.0)
MCHC: 33.9 g/dL (ref 30.0–36.0)
MCV: 87.1 fl (ref 78.0–100.0)
MONOS PCT: 9 % (ref 3.0–12.0)
Monocytes Absolute: 0.4 10*3/uL (ref 0.1–1.0)
NEUTROS ABS: 2.4 10*3/uL (ref 1.4–7.7)
Neutrophils Relative %: 59.8 % (ref 43.0–77.0)
Platelets: 218 10*3/uL (ref 150.0–400.0)
RBC: 4.57 Mil/uL (ref 3.87–5.11)
RDW: 14.2 % (ref 11.5–15.5)
WBC: 4.1 10*3/uL (ref 4.0–10.5)

## 2016-02-19 LAB — LIPID PANEL
CHOLESTEROL: 268 mg/dL — AB (ref 0–200)
HDL: 55 mg/dL (ref >39.00)
LDL Cholesterol: 178 mg/dL — ABNORMAL HIGH (ref 0–99)
NonHDL: 212.8
Total CHOL/HDL Ratio: 5
Triglycerides: 172 mg/dL — ABNORMAL HIGH (ref 0.0–149.0)
VLDL: 34.4 mg/dL (ref 0.0–40.0)

## 2016-02-19 LAB — BASIC METABOLIC PANEL
BUN: 15 mg/dL (ref 6–23)
CHLORIDE: 103 meq/L (ref 96–112)
CO2: 31 meq/L (ref 19–32)
CREATININE: 0.67 mg/dL (ref 0.40–1.20)
Calcium: 10.2 mg/dL (ref 8.4–10.5)
GFR: 96.46 mL/min (ref >60.00)
Glucose, Bld: 97 mg/dL (ref 70–99)
Potassium: 4.2 mEq/L (ref 3.5–5.1)
Sodium: 142 mEq/L (ref 135–145)

## 2016-02-19 LAB — TSH: TSH: 2.08 u[IU]/mL (ref 0.35–4.50)

## 2016-02-19 MED ORDER — CIPROFLOXACIN HCL 250 MG PO TABS: 250.0000 mg | ORAL_TABLET | Freq: Two times a day (BID) | ORAL | 0 refills | Status: DC

## 2016-02-19 MED ORDER — ALBUTEROL SULFATE HFA 108 (90 BASE) MCG/ACT IN AERS: 2.0000 | INHALATION_SPRAY | Freq: Four times a day (QID) | RESPIRATORY_TRACT | 6 refills | Status: DC | PRN

## 2016-02-19 MED ORDER — ERGOCALCIFEROL 1.25 MG (50000 UT) PO CAPS: 50000.0000 [IU] | ORAL_CAPSULE | ORAL | 0 refills | Status: DC

## 2016-02-19 MED ORDER — VITAMIN D3 1.25 MG (50000 UT) PO CAPS: ORAL_CAPSULE | ORAL | 3 refills | Status: DC

## 2016-02-19 NOTE — Progress Notes (Signed)
Subjective:  Patient ID: Sarah Waller, female    DOB: 09-29-1960  Age: 55 y.o. MRN: [id number]  CC: Annual Exam   HPI Sarah Waller presents for a well exam  Outpatient Medications Prior to Visit  Medication Sig Dispense Refill  . butalbital-acetaminophen-caffeine (FIORICET, ESGIC) 50-325-40 MG tablet TAKE 1 OR 2 TABLETS BY MOUTH TWICE DAILY AS NEEDED HEADACHE 30 tablet 1  . candesartan (ATACAND) 32 MG tablet Take 1 tablet (32 mg total) by mouth daily. 90 tablet 3  . cholecalciferol (VITAMIN D) 1000 units tablet Take 1,000 Units by mouth daily.    . diclofenac sodium (VOLTAREN) 1 % GEL Apply 4 g topically 4 (four) times daily. 200 g 3   No facility-administered medications prior to visit.     ROS Review of Systems  Constitutional: Negative for activity change, appetite change, chills, fatigue and unexpected weight change.  HENT: Negative for congestion, mouth sores and sinus pressure.   Eyes: Negative for visual disturbance.  Respiratory: Negative for cough and chest tightness.   Gastrointestinal: Negative for abdominal pain and nausea.  Genitourinary: Negative for difficulty urinating, frequency and vaginal pain.  Musculoskeletal: Negative for back pain and gait problem.  Skin: Negative for pallor and rash.  Neurological: Negative for dizziness, tremors, weakness, numbness and headaches.  Psychiatric/Behavioral: Negative for confusion, sleep disturbance and suicidal ideas.    Objective:  BP 120/90   Pulse 82   Ht 5\' 5"  (1.651 m)   Wt 176 lb (79.8 kg)   LMP 08/10/2013 Comment: PMP bleeding  SpO2 95%   BMI 29.29 kg/m   BP Readings from Last 3 Encounters:  02/19/16 120/90  09/18/15 130/80  04/13/15 122/84    Wt Readings from Last 3 Encounters:  02/19/16 176 lb (79.8 kg)  09/18/15 176 lb 9.6 oz (80.1 kg)  04/13/15 181 lb (82.1 kg)    Physical Exam  Constitutional: She appears well-developed. No distress.  HENT:  Head: Normocephalic.  Right Ear:  External ear normal.  Left Ear: External ear normal.  Nose: Nose normal.  Mouth/Throat: Oropharynx is clear and moist.  Eyes: Conjunctivae are normal. Pupils are equal, round, and reactive to light. Right eye exhibits no discharge. Left eye exhibits no discharge.  Neck: Normal range of motion. Neck supple. No JVD present. No tracheal deviation present. No thyromegaly present.  Cardiovascular: Normal rate, regular rhythm and normal heart sounds.   Pulmonary/Chest: No stridor. No respiratory distress. She has no wheezes.  Abdominal: Soft. Bowel sounds are normal. She exhibits no distension and no mass. There is no tenderness. There is no rebound and no guarding.  Musculoskeletal: She exhibits no edema or tenderness.  Lymphadenopathy:    She has no cervical adenopathy.  Neurological: She displays normal reflexes. No cranial nerve deficit. She exhibits normal muscle tone. Coordination normal.  Skin: No rash noted. No erythema.  Psychiatric: She has a normal mood and affect. Her behavior is normal. Judgment and thought content normal.    Lab Results  Component Value Date   WBC 5.7 04/02/2015   HGB 13.4 04/02/2015   HCT 40.9 04/02/2015   PLT 223.0 04/02/2015   GLUCOSE 95 05/10/2013   CHOL 251 (H) 06/04/2012   TRIG 114.0 06/04/2012   HDL 52.80 06/04/2012   LDLDIRECT 157.3 06/04/2012   ALT 36 (H) 05/10/2013   AST 28 05/10/2013   NA 141 05/10/2013   K 4.2 05/10/2013   CL 105 05/10/2013   CREATININE 0.7 05/10/2013   BUN 14 05/10/2013   CO2  30 05/10/2013   TSH 1.34 03/17/2013   INR 0.9 ratio 09/05/2009    No results found.  Assessment & Plan:   There are no diagnoses linked to this encounter. I am having Sarah Waller maintain her diclofenac sodium, candesartan, cholecalciferol, and butalbital-acetaminophen-caffeine.  No orders of the defined types were placed in this encounter.    Follow-up: No Follow-up on file.  Walker Kehr, MD

## 2016-02-19 NOTE — Assessment & Plan Note (Signed)
Doing well 

## 2016-02-19 NOTE — Telephone Encounter (Signed)
Pharmacist left msg on triage stating MD just sent rx for vitamin D3 50,000. There isn't a vit D3 50,000 vitamin D3 are otc. If MD needing 50,000 will have to change to vitamin D2. Updated script sent correct vitamin D.../lmb

## 2016-02-19 NOTE — Progress Notes (Signed)
Pre visit review using our clinic review tool, if applicable. No additional management support is needed unless otherwise documented below in the visit note. 

## 2016-02-19 NOTE — Assessment & Plan Note (Signed)
On [place]

## 2016-02-19 NOTE — Assessment & Plan Note (Signed)
We discussed age appropriate health related issues, including available/recomended screening tests and vaccinations. We discussed a need for adhering to healthy diet and exercise. Labs/EKG were reviewed/ordered. All questions were answered.  Colonoscopy is up to date 

## 2016-02-20 LAB — HEPATITIS C ANTIBODY: HCV AB: NEGATIVE

## 2016-02-20 LAB — HIV ANTIBODY (ROUTINE TESTING W REFLEX): HIV 1&2 Ab, 4th Generation: NONREACTIVE

## 2016-02-25 ENCOUNTER — Telehealth: Payer: Self-pay | Admitting: Internal Medicine

## 2016-02-25 MED ORDER — FLUCONAZOLE 150 MG PO TABS: 150.0000 mg | ORAL_TABLET | Freq: Once | ORAL | 0 refills | Status: AC

## 2016-02-25 NOTE — Telephone Encounter (Signed)
Done. See meds.  

## 2016-02-25 NOTE — Telephone Encounter (Signed)
Patient is requesting an RX for diflucan sent to pharmacy on file.

## 2016-03-14 ENCOUNTER — Ambulatory Visit: Payer: Self-pay | Admitting: Obstetrics & Gynecology

## 2016-03-28 ENCOUNTER — Other Ambulatory Visit: Payer: Self-pay | Admitting: Internal Medicine

## 2016-04-08 ENCOUNTER — Encounter: Payer: Self-pay | Admitting: Obstetrics & Gynecology

## 2016-04-08 ENCOUNTER — Ambulatory Visit (INDEPENDENT_AMBULATORY_CARE_PROVIDER_SITE_OTHER): Payer: BLUE CROSS/BLUE SHIELD | Admitting: Obstetrics & Gynecology

## 2016-04-08 VITALS — BP 144/88 | HR 76 | Resp 14 | Ht 64.5 in | Wt 180.0 lb

## 2016-04-08 DIAGNOSIS — R51 Headache: Secondary | ICD-10-CM

## 2016-04-08 DIAGNOSIS — G510 Bell's palsy: Secondary | ICD-10-CM

## 2016-04-08 DIAGNOSIS — Z01419 Encounter for gynecological examination (general) (routine) without abnormal findings: Secondary | ICD-10-CM | POA: Diagnosis not present

## 2016-04-08 DIAGNOSIS — N3090 Cystitis, unspecified without hematuria: Secondary | ICD-10-CM

## 2016-04-08 DIAGNOSIS — Z0000 Encounter for general adult medical examination without abnormal findings: Secondary | ICD-10-CM | POA: Diagnosis not present

## 2016-04-08 DIAGNOSIS — R519 Headache, unspecified: Secondary | ICD-10-CM

## 2016-04-08 DIAGNOSIS — Z124 Encounter for screening for malignant neoplasm of cervix: Secondary | ICD-10-CM

## 2016-04-08 LAB — POCT URINALYSIS DIPSTICK
Bilirubin, UA: NEGATIVE
Blood, UA: NEGATIVE
Glucose, UA: NEGATIVE
KETONES UA: NEGATIVE
LEUKOCYTES UA: NEGATIVE
Nitrite, UA: NEGATIVE
PH UA: 7
PROTEIN UA: NEGATIVE
UROBILINOGEN UA: NEGATIVE

## 2016-04-08 MED ORDER — VALACYCLOVIR HCL 1 G PO TABS: 1000.0000 mg | ORAL_TABLET | Freq: Three times a day (TID) | ORAL | 0 refills | Status: DC

## 2016-04-08 MED ORDER — HYDROCORTISONE 2.5 % RE CREA: 1.0000 "application " | TOPICAL_CREAM | Freq: Two times a day (BID) | RECTAL | 1 refills | Status: DC

## 2016-04-08 MED ORDER — HYDROCORTISONE ACETATE 25 MG RE SUPP: 25.0000 mg | Freq: Two times a day (BID) | RECTAL | 1 refills | Status: DC

## 2016-04-08 MED ORDER — PREDNISOLONE 5 MG PO TABS: ORAL_TABLET | ORAL | 0 refills | Status: DC

## 2016-04-08 NOTE — Progress Notes (Deleted)
55 y.o. G4P1 MarriedCaucasianF here for annual exam.    Patient's last menstrual period was 08/10/2013.          Sexually active: No.  The current method of family planning is post menopausal status.    Exercising: Yes.    Gym Smoker:  no  Health Maintenance: Pap:  12/08/14 negative  History of abnormal Pap:  no MMG:  09/12/15 BIRADS 2 benign  Colonoscopy:  2010 Dr. Olevia Perches- repeat 10 years  BMD:   09/15/12 osteopenia  TDaP:  05/03/13  Pneumonia vaccine(s):  no Zostavax:   no Hep C testing: 02-19-16- [facility]  Screening Labs: PCP does labs , Urine today: urine culture   reports that she has never smoked. She has never used smokeless tobacco. She reports that she does not drink alcohol or use drugs.  Past Medical History:  Diagnosis Date  . Abnormal weight gain   . Allergic rhinitis, cause unspecified   . Cervicalgia   . Cystitis, unspecified   . Depression   . Diaphragmatic hernia without mention of obstruction or gangrene   . Dizziness and giddiness   . Esophageal reflux   . Flatulence, eructation, and gas pain   . Generalized hyperhidrosis   . Headache(784.0)   . Irritable bowel syndrome   . Lumbago   . Malignant neoplasm of breast (female), unspecified site   . Migraine without aura, with intractable migraine, so stated, without mention of status migrainosus   . Nonspecific abnormal electrocardiogram (ECG) (EKG)   . Other specified disorder of rectum and anus   . Pain in joint, shoulder region   . Pain in limb   . Painful respiration   . Paroxysmal tachycardia, unspecified   . Peptic ulcer, unspecified site, unspecified as acute or chronic, without mention of hemorrhage, perforation, or obstruction   . Personal history of contact with and (suspected) exposure to potentially hazardous body fluids   . Personal history of malignant neoplasm of breast   . Rash and other nonspecific skin eruption   . Unspecified asthma(493.90)   . Vomiting alone     Past Surgical History:   Procedure Laterality Date  . BREAST LUMPECTOMY    . HYSTEROSCOPY W/D&C    . left leg tumor removal  2002   benign  . LIPOMA EXCISION  2002   left shoulder  . teeth implant  7/09   2 teeth  . TUMOR REMOVAL     subcutaneous of right thigh    Current Outpatient Prescriptions  Medication Sig Dispense Refill  . albuterol (PROAIR HFA) 108 (90 Base) MCG/ACT inhaler Inhale 2 puffs into the lungs every 6 (six) hours as needed for wheezing or shortness of breath. 1 Inhaler 6  . butalbital-acetaminophen-caffeine (FIORICET, ESGIC) 50-325-40 MG tablet TAKE 1 OR 2 TABLETS BY MOUTH TWICE DAILY AS NEEDED HEADACHE 30 tablet 1  . candesartan (ATACAND) 32 MG tablet Take 1 tablet (32 mg total) by mouth daily. 90 tablet 3  . diclofenac sodium (VOLTAREN) 1 % GEL Apply 4 g topically 4 (four) times daily. 200 g 3  . Vitamin D, Ergocalciferol, (DRISDOL) 50000 units CAPS capsule TAKE ONE CAPSULE BY MOUTH EVERY 30 DAYS 6 capsule 0  . triamterene-hydrochlorothiazide (MAXZIDE-25) 37.5-25 MG tablet   7   No current facility-administered medications for this visit.     Family History  Problem Relation Age of Onset  . COPD Mother   . Diabetes Mother   . Heart disease Mother   . Hypertension Mother   .  Hypertension Other   . Colon cancer Neg Hx     ROS:  Pertinent items are noted in HPI.  Otherwise, a comprehensive ROS was negative.  Exam:   BP (!) 144/88 (BP Location: Right Arm, Patient Position: Sitting, Cuff Size: Normal)   Pulse 76   Resp 14   Ht 5' 4.5" (1.638 m)   Wt 180 lb (81.6 kg)   LMP 08/10/2013 Comment: PMP bleeding  BMI 30.42 kg/m     Height: 5' 4.5" (163.8 cm)  Ht Readings from Last 3 Encounters:  04/08/16 5' 4.5" (1.638 m)  02/19/16 5\' 5"  (1.651 m)  04/13/15 5' 5.25" (1.657 m)   General appearance: alert, cooperative and appears stated age Head: Normocephalic, without obvious abnormality, atraumatic Neck: no adenopathy, supple, symmetrical, trachea midline and thyroid {EXAM;  THYROID:18604} Lungs: clear to auscultation bilaterally Breasts: {Exam; breast:13139::"normal appearance, no masses or tenderness"} Heart: regular rate and rhythm Abdomen: soft, non-tender; bowel sounds normal; no masses,  no organomegaly Extremities: extremities normal, atraumatic, no cyanosis or edema Skin: Skin color, texture, turgor normal. No rashes or lesions Lymph nodes: Cervical, supraclavicular, and axillary nodes normal. No abnormal inguinal nodes palpated Neurologic: Grossly normal   Pelvic: External genitalia:  no lesions              Urethra:  normal appearing urethra with no masses, tenderness or lesions              Bartholins and Skenes: normal                 Vagina: normal appearing vagina with normal color and discharge, no lesions              Cervix: {exam; cervix:14595}              Pap taken: {yes no:314532} Bimanual Exam:  Uterus:  {exam; uterus:12215}              Adnexa: {exam; adnexa:12223}               Rectovaginal: Confirms               Anus:  normal sphincter tone, no lesions  Chaperone was present for exam.  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  Stopped Tamoxifen after 7 years.  Has not seen oncologist (Dr. Marin Olp) in two years and doesn't desire to have follow-up H/O mild anemia H/O migraines H/O depression Ashkenazic Jewish heritage Hemorrhoid  P: Mammogram yearly. Every other year MRI have been recommended pap smear with neg HR HPV 3/13. Pap only 2014.  Pap today  Labs with Dr. Marisa Severin [facility] 2.5% ointment and hydrocortisone suppositories 25mg  BID rx to pharmacy to use prn return annually or prn

## 2016-04-08 NOTE — Progress Notes (Addendum)
55 y.o. G4P1 MarriedCaucasianF here for annual exam.  Reports she is very anxious today as she's had right ear pain and some numbness of her right cheek for a day or so.  Also, feels like the right eye cannot close as tightly as the left eye.  Has hx of Bell's Palsy on the left side and this started the exact same way.  Had a severe Bell's Palsy on the left when it occurred years ago.  Having stressors with son.  He was in practice in [place] and provider (MD) decided to leave the company that employed them.  He left too and this has now involved legal proceeding, a new practice, selling home and moving to [place] and a commute of over 1 hour each day.  As well, his wife is expecting second child in February.  Pt worried about them.   Reports this "worrying" has caused a lot of constipation and she is having a lot of issues with hemorrhoids as well.  Using OTC products.  D/w pt prescription options and she would like to try.   Denies vaginal bleeding.  Did have recent UTI without culture.  Took antibiotics but not sure if symptoms are fully resolved.  Would like urine culture today.  Patient's last menstrual period was 08/10/2013.          Sexually active: No.  The current method of family planning is post menopausal status.    Exercising: Yes.    Gym Smoker:  no  Health Maintenance: Pap:  12/08/14 negative  History of abnormal Pap:  no MMG:  09/12/15 BIRADS 2 benign  Colonoscopy:  2010 Dr. Olevia Perches- repeat 10 years  BMD:   09/15/12 osteopenia  TDaP:  05/03/13  Pneumonia vaccine(s):  no Zostavax:   no Hep C testing: 02-19-16 negative Screening Labs: PCP does labs , Urine today: urine culture   reports that she has never smoked. She has never used smokeless tobacco. She reports that she does not drink alcohol or use drugs.  Past Medical History:  Diagnosis Date  . Abnormal weight gain   . Allergic rhinitis, cause unspecified   . Cervicalgia   . Cystitis, unspecified   . Depression    . Diaphragmatic hernia without mention of obstruction or gangrene   . Dizziness and giddiness   . Esophageal reflux   . Flatulence, eructation, and gas pain   . Generalized hyperhidrosis   . Headache(784.0)   . Irritable bowel syndrome   . Lumbago   . Malignant neoplasm of breast (female), unspecified site   . Migraine without aura, with intractable migraine, so stated, without mention of status migrainosus   . Nonspecific abnormal electrocardiogram (ECG) (EKG)   . Other specified disorder of rectum and anus   . Pain in joint, shoulder region   . Pain in limb   . Painful respiration   . Paroxysmal tachycardia, unspecified   . Peptic ulcer, unspecified site, unspecified as acute or chronic, without mention of hemorrhage, perforation, or obstruction   . Personal history of contact with and (suspected) exposure to potentially hazardous body fluids   . Personal history of malignant neoplasm of breast   . Rash and other nonspecific skin eruption   . Unspecified asthma(493.90)   . Vomiting alone     Past Surgical History:  Procedure Laterality Date  . BREAST LUMPECTOMY    . HYSTEROSCOPY W/D&C    . left leg tumor removal  2002   benign  . LIPOMA EXCISION  2002  left shoulder  . teeth implant  7/09   2 teeth  . TUMOR REMOVAL     subcutaneous of right thigh    Current Outpatient Prescriptions  Medication Sig Dispense Refill  . albuterol (PROAIR HFA) 108 (90 Base) MCG/ACT inhaler Inhale 2 puffs into the lungs every 6 (six) hours as needed for wheezing or shortness of breath. 1 Inhaler 6  . butalbital-acetaminophen-caffeine (FIORICET, ESGIC) 50-325-40 MG tablet TAKE 1 OR 2 TABLETS BY MOUTH TWICE DAILY AS NEEDED HEADACHE 30 tablet 1  . candesartan (ATACAND) 32 MG tablet Take 1 tablet (32 mg total) by mouth daily. 90 tablet 3  . diclofenac sodium (VOLTAREN) 1 % GEL Apply 4 g topically 4 (four) times daily. 200 g 3  . Vitamin D, Ergocalciferol, (DRISDOL) 50000 units CAPS capsule TAKE  ONE CAPSULE BY MOUTH EVERY 30 DAYS 6 capsule 0  . triamterene-hydrochlorothiazide (MAXZIDE-25) 37.5-25 MG tablet   7   No current facility-administered medications for this visit.     Family History  Problem Relation Age of Onset  . COPD Mother   . Diabetes Mother   . Heart disease Mother   . Hypertension Mother   . Hypertension Other   . Colon cancer Neg Hx     ROS:  Pertinent items are noted in HPI.  Otherwise, a comprehensive ROS was negative.  Exam:   BP (!) 144/88 (BP Location: Right Arm, Patient Position: Sitting, Cuff Size: Normal)   Pulse 76   Resp 14   Ht 5' 4.5" (1.638 m)   Wt 180 lb (81.6 kg)   LMP 08/10/2013 Comment: PMP bleeding  BMI 30.42 kg/m     Height: 5' 4.5" (163.8 cm)  Ht Readings from Last 3 Encounters:  04/08/16 5' 4.5" (1.638 m)  02/19/16 5\' 5"  (1.651 m)  04/13/15 5' 5.25" (1.657 m)   General appearance: alert, cooperative and appears stated age Head: Normocephalic, without obvious abnormality, atraumatic, smile is slightly asymmetric and right eye does not appear to close as tightly at the left eye Neck: no adenopathy, supple, symmetrical, trachea midline and thyroid normal to inspection and palpation Lungs: clear to auscultation bilaterally Breasts:well healed scars left breast with radiation changes that are stable, right breast without masses, skin changes, nipple discharge, no LAD either axilla Heart: regular rate and rhythm Abdomen: soft, non-tender; bowel sounds normal; no masses,  no organomegaly Extremities: extremities normal, atraumatic, no cyanosis or edema Skin: Skin color, texture, turgor normal. No rashes or lesions Lymph nodes: Cervical, supraclavicular, and axillary nodes normal. No abnormal inguinal nodes palpated Neurologic: Grossly normal  Pelvic: External genitalia:  no lesions              Urethra:  normal appearing urethra with no masses, tenderness or lesions              Bartholins and Skenes: normal                  Vagina: normal appearing vagina with normal color and discharge, no lesions              Cervix: no lesions              Pap taken: Yes.   Bimanual Exam:  Uterus:  normal size, contour, position, consistency, mobility, non-tender              Adnexa: normal adnexa and no mass, fullness, tenderness               Rectovaginal: Confirms  Anus:  normal sphincter tone, no lesions  Chaperone was present for exam.  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  Stopped Tamoxifen after 7 years.  Has not seen oncologist (Dr. Marin Olp) in several years and does not desire follow-up H/O mild anemia H/O migraines, much improved since menopause H/O depression Ashkenazic Jewish heritage Hemorrhoids H/O recent UTI Stressors related to son's job  P: Mammogram yearly. Every other year MRI have been recommended.  Pt has not done one in several years due to cost.  Planning to do this in 2018 after next MMG. Pap and HR HPV obtained today Labs with Dr. Alain Marion Prednisolone 60mg  daily x 5 days then decreasing by 10mg  daily for total of 10 days.  Valtrex 1000mg  tid x 10 days.  May need neurology referral.  Pt to give update in 2 days. Anusol HC 2.5% ointment and hydrocortisone suppositories 25mg  BID to be used prn.  Rx to pharmacy.   Rx for Vit D 50K once monthly.  Does not need rx at this time. Return annually or prn  Additional time spent in discussion of hx of Bell's Palsy as well as Up To Date recommendations regarding treatment.

## 2016-04-09 ENCOUNTER — Telehealth: Payer: Self-pay | Admitting: Internal Medicine

## 2016-04-09 ENCOUNTER — Telehealth: Payer: Self-pay | Admitting: Emergency Medicine

## 2016-04-09 NOTE — Telephone Encounter (Signed)
OK OV tomorrow 1 pm Thx

## 2016-04-09 NOTE — Telephone Encounter (Signed)
She should wait on acupuncture Thx

## 2016-04-09 NOTE — Telephone Encounter (Signed)
Patient Name: Sarah Waller  DOB: 1961-02-25    Initial Comment Caller states she has Bell's palsy, worse than yesterday. Once side of face swollen, pain.   Nurse Assessment  Nurse: Raphael Gibney, RN, Vanita Ingles Date/Time (Eastern Time): 04/09/2016 8:48:00 AM  Confirm and document reason for call. If symptomatic, describe symptoms. You must click the next button to save text entered. ---Caller states she has bell's palsy. Saw another doctor yesterday who thought she had bell's palsy. Had numbness in her tongue and her eye yesterday. She worked yesterday. Today the left side of her face is more painful and swollen. Has a little more numbness.  Does the patient have any new or worsening symptoms? ---Yes  Will a triage be completed? ---Yes  Related visit to physician within the last 2 weeks? ---No  Does the PT have any chronic conditions? (i.e. diabetes, asthma, etc.) ---No  Is this a behavioral health or substance abuse call? ---No     Guidelines    Guideline Title Affirmed Question Affirmed Notes  Neurologic Deficit Bell's palsy suspected (i.e., weakness on only one side of the face, developing over hours to days, no other symptoms)    Final Disposition User   Go to ED Now (or PCP triage) Raphael Gibney, RN, Vera    Comments  no appts available in [facility]. Pt does not want to go to urgent care or ER or to another office.  called back line and spoke to [facility] and gave report that pt had a little numbness in her tongue and eye yesterday. Today the left side of her face has more numbness and is painful and swollen. Triage outcome of go to ER (or PCP triage). No appts available at [facility] Pt does not want to go to urgent care or ER.   Referrals  GO TO FACILITY REFUSED   Disagree/Comply: Disagree  Disagree/Comply Reason: Disagree with instructions

## 2016-04-09 NOTE — Telephone Encounter (Signed)
Pt informed - She was offered OV on Thur 04/10/16.  She is currently taking Prednisone 60 mg and Valacyclovir. She is already scheduled to see PCP on 04/11/16. She wants to know if she can start acupuncture or should she wait until this resolves and OR OV with you on 04/11/2016? Please advise.   Call pt back at [phone]

## 2016-04-09 NOTE — Telephone Encounter (Signed)
FYI: Received call from Sarah Waller, Pt was complaing of numbness in her tongue and eye yesterday. Pt refused to go to ED. [facility] had no openings today.

## 2016-04-10 LAB — URINE CULTURE: ORGANISM ID, BACTERIA: NO GROWTH

## 2016-04-10 NOTE — Telephone Encounter (Signed)
Pt informed

## 2016-04-11 ENCOUNTER — Encounter: Payer: Self-pay | Admitting: Internal Medicine

## 2016-04-11 ENCOUNTER — Ambulatory Visit: Payer: BLUE CROSS/BLUE SHIELD | Admitting: Internal Medicine

## 2016-04-11 ENCOUNTER — Ambulatory Visit (INDEPENDENT_AMBULATORY_CARE_PROVIDER_SITE_OTHER): Payer: BLUE CROSS/BLUE SHIELD | Admitting: Internal Medicine

## 2016-04-11 DIAGNOSIS — G510 Bell's palsy: Secondary | ICD-10-CM | POA: Insufficient documentation

## 2016-04-11 DIAGNOSIS — J0100 Acute maxillary sinusitis, unspecified: Secondary | ICD-10-CM

## 2016-04-11 MED ORDER — CEFDINIR 300 MG PO CAPS: 300.0000 mg | ORAL_CAPSULE | Freq: Two times a day (BID) | ORAL | 0 refills | Status: DC

## 2016-04-11 MED ORDER — GABAPENTIN 300 MG PO CAPS: 300.0000 mg | ORAL_CAPSULE | Freq: Two times a day (BID) | ORAL | 1 refills | Status: DC | PRN

## 2016-04-11 MED ORDER — PREDNISOLONE 5 MG PO TABS: ORAL_TABLET | ORAL | 0 refills | Status: DC

## 2016-04-11 NOTE — Progress Notes (Signed)
Pre visit review using our clinic review tool, if applicable. No additional management support is needed unless otherwise documented below in the visit note. 

## 2016-04-11 NOTE — Progress Notes (Signed)
Subjective:  Patient ID: Sarah Waller, female    DOB: 02-28-1961  Age: 55 y.o. MRN: [id number]  CC: No chief complaint on file.   HPI Yorley Lanter presents for R Bell's palsy staring this weekend. The pt saw Dr Sabra Heck - given Prednisone and Valtrex. Taking 80 mg Pred a day by error. C/o sinusitis sx's  Outpatient Medications Prior to Visit  Medication Sig Dispense Refill  . albuterol (PROAIR HFA) 108 (90 Base) MCG/ACT inhaler Inhale 2 puffs into the lungs every 6 (six) hours as needed for wheezing or shortness of breath. 1 Inhaler 6  . butalbital-acetaminophen-caffeine (FIORICET, ESGIC) 50-325-40 MG tablet TAKE 1 OR 2 TABLETS BY MOUTH TWICE DAILY AS NEEDED HEADACHE 30 tablet 1  . candesartan (ATACAND) 32 MG tablet Take 1 tablet (32 mg total) by mouth daily. 90 tablet 3  . diclofenac sodium (VOLTAREN) 1 % GEL Apply 4 g topically 4 (four) times daily. 200 g 3  . hydrocortisone (ANUSOL-HC) 25 MG suppository Place 1 suppository (25 mg total) rectally 2 (two) times daily. 15 suppository 1  . prednisoLONE 5 MG TABS tablet 60mg  daily for 5 days, decrease by 10mg  daily for total of 10 days of treatment. 90 each 0  . triamterene-hydrochlorothiazide (MAXZIDE-25) 37.5-25 MG tablet   7  . valACYclovir (VALTREX) 1000 MG tablet Take 1 tablet (1,000 mg total) by mouth 3 (three) times daily. Take for 10 days 30 tablet 0  . Vitamin D, Ergocalciferol, (DRISDOL) 50000 units CAPS capsule TAKE ONE CAPSULE BY MOUTH EVERY 30 DAYS 6 capsule 0  . hydrocortisone (ANUSOL-HC) 2.5 % rectal cream Place 1 application rectally 2 (two) times daily. 30 g 1   No facility-administered medications prior to visit.     ROS Review of Systems  Constitutional: Negative for activity change, appetite change, chills, fatigue and unexpected weight change.  HENT: Negative for congestion, mouth sores and sinus pressure.   Eyes: Negative for visual disturbance.  Respiratory: Negative for cough and chest tightness.     Gastrointestinal: Negative for abdominal pain and nausea.  Genitourinary: Negative for difficulty urinating, frequency and vaginal pain.  Musculoskeletal: Negative for back pain and gait problem.  Skin: Negative for pallor and rash.  Neurological: Negative for dizziness, tremors, weakness, numbness and headaches.  Psychiatric/Behavioral: Negative for confusion, sleep disturbance and suicidal ideas.  R facial paralysis  Objective:  BP (!) 148/98 (BP Location: Left Arm, Patient Position: Sitting, Cuff Size: Normal)   Pulse 90   Temp 97.9 F (36.6 C) (Oral)   Ht 5' 4.5" (1.638 m)   Wt 179 lb (81.2 kg)   LMP 08/10/2013 Comment: PMP bleeding  SpO2 98%   BMI 30.25 kg/m   BP Readings from Last 3 Encounters:  04/11/16 (!) 148/98  04/08/16 (!) 144/88  02/19/16 120/90    Wt Readings from Last 3 Encounters:  04/11/16 179 lb (81.2 kg)  04/08/16 180 lb (81.6 kg)  02/19/16 176 lb (79.8 kg)    Physical Exam  Constitutional: She appears well-developed. No distress.  HENT:  Head: Normocephalic.  Right Ear: External ear normal.  Left Ear: External ear normal.  Nose: Nose normal.  Mouth/Throat: Oropharynx is clear and moist.  Eyes: Conjunctivae are normal. Pupils are equal, round, and reactive to light. Right eye exhibits no discharge. Left eye exhibits no discharge.  Neck: Normal range of motion. Neck supple. No JVD present. No tracheal deviation present. No thyromegaly present.  Cardiovascular: Normal rate, regular rhythm and normal heart sounds.   Pulmonary/Chest: No  stridor. No respiratory distress. She has no wheezes.  Abdominal: Soft. Bowel sounds are normal. She exhibits no distension and no mass. There is no tenderness. There is no rebound and no guarding.  Musculoskeletal: She exhibits no edema or tenderness.  Lymphadenopathy:    She has no cervical adenopathy.  Neurological: She displays normal reflexes. A cranial nerve deficit is present. She exhibits normal muscle tone.  Coordination normal.  Skin: No rash noted. No erythema.  Psychiatric: Her behavior is normal. Judgment and thought content normal.  R facial droop Sad  Lab Results  Component Value Date   WBC 4.1 02/19/2016   HGB 13.5 02/19/2016   HCT 39.8 02/19/2016   PLT 218.0 02/19/2016   GLUCOSE 97 02/19/2016   CHOL 268 (H) 02/19/2016   TRIG 172.0 (H) 02/19/2016   HDL 55.00 02/19/2016   LDLDIRECT 157.3 06/04/2012   LDLCALC 178 (H) 02/19/2016   ALT 22 02/19/2016   AST 18 02/19/2016   NA 142 02/19/2016   K 4.2 02/19/2016   CL 103 02/19/2016   CREATININE 0.67 02/19/2016   BUN 15 02/19/2016   CO2 31 02/19/2016   TSH 2.08 02/19/2016   INR 0.9 ratio 09/05/2009    No results found.  Assessment & Plan:   There are no diagnoses linked to this encounter. I am having Ms. Rossell maintain her diclofenac sodium, candesartan, butalbital-acetaminophen-caffeine, albuterol, Vitamin D (Ergocalciferol), triamterene-hydrochlorothiazide, prednisoLONE, valACYclovir, and hydrocortisone.  No orders of the defined types were placed in this encounter.    Follow-up: No Follow-up on file.  Walker Kehr, MD

## 2016-04-14 ENCOUNTER — Encounter: Payer: Self-pay | Admitting: Internal Medicine

## 2016-04-14 DIAGNOSIS — J019 Acute sinusitis, unspecified: Secondary | ICD-10-CM | POA: Insufficient documentation

## 2016-04-14 LAB — IPS PAP TEST WITH HPV

## 2016-04-14 NOTE — Assessment & Plan Note (Signed)
Prednisone taper Gabapentin prn Valtrex Eye drops

## 2016-04-14 NOTE — Assessment & Plan Note (Signed)
Cefdinir po 

## 2016-04-15 ENCOUNTER — Telehealth: Payer: Self-pay | Admitting: *Deleted

## 2016-04-15 ENCOUNTER — Telehealth: Payer: Self-pay | Admitting: Internal Medicine

## 2016-04-15 NOTE — Telephone Encounter (Signed)
OK tomorrow 11:30 Thx

## 2016-04-15 NOTE — Telephone Encounter (Signed)
Patient states her asthma is acting up and she feels she has pneumonia.  She is requesting Dr. Camila Li to work her in.  She does not want to see anyone else.

## 2016-04-15 NOTE — Telephone Encounter (Signed)
-----   Message from Megan Salon, MD sent at 04/15/2016  6:20 AM EST ----- Please let pt know her pap was normal and the HR HPV was negative.  Also, her urine culture was negative.  Can you see how her face is doing?  She thought she might be having the beginning of a Bell's Palsy.  Thanks.  I did release lab work to her.  FYI.

## 2016-04-15 NOTE — Telephone Encounter (Signed)
Call to patient. Results reviewed with patient and she verbalized understanding. Patient states that she is much worse this week. States she has moderate "Bell's Palsy" and a severe case of the flu. States she has seen her PCP and was started on prednisone, but that it does not seem to be working. Patient states she is "just sick." RN advised this message would be sent to Dr. Sabra Heck for review and our office would call with any additional recommendations.   Routing to provider for review.

## 2016-04-16 NOTE — Telephone Encounter (Signed)
Will have scheduler call pt to schedule OV.

## 2016-04-16 NOTE — Telephone Encounter (Signed)
Patient states she is in [facility] today and can not come.  Patient would like to know if he could do another day?

## 2016-04-16 NOTE — Telephone Encounter (Signed)
I do not have additional recommendations.  She is doing the right things for this.  I'm sorry she's going through it again.

## 2016-04-17 NOTE — Telephone Encounter (Signed)
Left message for patient per DPR as seen below from Dr. Sabra Heck.    Routing to provider for final review. Patient agreeable to disposition. Will close encounter.

## 2016-04-17 NOTE — Telephone Encounter (Signed)
11:45 today? Thx

## 2016-04-17 NOTE — Telephone Encounter (Signed)
Notified pt w/ MD response. She states she is feeling a little better she just had a asthma attack. If sxs starts again will call to make appt...Johny Chess

## 2016-05-01 ENCOUNTER — Telehealth: Payer: Self-pay | Admitting: Internal Medicine

## 2016-05-01 ENCOUNTER — Encounter: Payer: Self-pay | Admitting: Internal Medicine

## 2016-05-01 NOTE — Telephone Encounter (Signed)
PCP gave me completed Sickness Claim form back. Form faxed to number on bottom of form and sent to be scanned. Left detailed mess informing pt.

## 2016-05-01 NOTE — Telephone Encounter (Signed)
Patient called to follow up on disability forms dropped off on 04/28/2016. Advised no status as of yet, but I would pass the message before you leave for holiday

## 2016-07-10 ENCOUNTER — Other Ambulatory Visit: Payer: Self-pay | Admitting: Internal Medicine

## 2016-07-10 NOTE — Telephone Encounter (Signed)
Called refill into [facility] left on pharmacy vm.../.lmb 

## 2016-07-30 ENCOUNTER — Encounter: Payer: Self-pay | Admitting: Internal Medicine

## 2016-07-30 ENCOUNTER — Ambulatory Visit (INDEPENDENT_AMBULATORY_CARE_PROVIDER_SITE_OTHER): Payer: BLUE CROSS/BLUE SHIELD | Admitting: Internal Medicine

## 2016-07-30 DIAGNOSIS — H9201 Otalgia, right ear: Secondary | ICD-10-CM

## 2016-07-30 DIAGNOSIS — J3089 Other allergic rhinitis: Secondary | ICD-10-CM

## 2016-07-30 DIAGNOSIS — I10 Essential (primary) hypertension: Secondary | ICD-10-CM | POA: Diagnosis not present

## 2016-07-30 DIAGNOSIS — J029 Acute pharyngitis, unspecified: Secondary | ICD-10-CM | POA: Diagnosis not present

## 2016-07-30 MED ORDER — BUTALBITAL-APAP-CAFFEINE 50-325-40 MG PO TABS: ORAL_TABLET | ORAL | 2 refills | Status: DC

## 2016-07-30 MED ORDER — CEFDINIR 300 MG PO CAPS: 300.0000 mg | ORAL_CAPSULE | Freq: Two times a day (BID) | ORAL | 0 refills | Status: DC

## 2016-07-30 MED ORDER — GABAPENTIN 300 MG PO CAPS: 300.0000 mg | ORAL_CAPSULE | Freq: Two times a day (BID) | ORAL | 1 refills | Status: DC | PRN

## 2016-07-30 MED ORDER — CANDESARTAN CILEXETIL 32 MG PO TABS: 32.0000 mg | ORAL_TABLET | Freq: Every day | ORAL | 3 refills | Status: DC

## 2016-07-30 MED ORDER — TRIAMTERENE-HCTZ 37.5-25 MG PO TABS: 1.0000 | ORAL_TABLET | Freq: Every day | ORAL | 3 refills | Status: DC

## 2016-07-30 NOTE — Assessment & Plan Note (Signed)
claritin prn  

## 2016-07-30 NOTE — Assessment & Plan Note (Signed)
[place]ron[place]

## 2016-07-30 NOTE — Progress Notes (Signed)
Pre visit review using our clinic review tool, if applicable. No additional management support is needed unless otherwise documented below in the visit note. 

## 2016-07-30 NOTE — Assessment & Plan Note (Signed)
R ST and R ear pain x 1 - 1.5 mo: new ENT ref Cefdinir Gabapentin prn

## 2016-07-30 NOTE — Assessment & Plan Note (Signed)
R ST and R ear pain x 1 - 1.5 mo: new

## 2016-07-30 NOTE — Progress Notes (Signed)
Subjective:  Patient ID: Sarah Waller, female    DOB: [birth year]  Age: 56 y.o. MRN: [id number]  CC: No chief complaint on file.   HPI [name] presents for R ST and R ear pain x 1 - 1.5 mo: new F/u Bell's palsy - Nov 2017 - resolved F/u HTN - needs Rx   Outpatient Medications Prior to Visit  Medication Sig Dispense Refill  . albuterol (PROAIR HFA) 108 (90 Base) MCG/ACT inhaler Inhale 2 puffs into the lungs every 6 (six) hours as needed for wheezing or shortness of breath. 1 Inhaler 6  . butalbital-acetaminophen-caffeine (FIORICET, ESGIC) 50-325-40 MG tablet TAKE ONE TO TWO TABLETS BY MOUTH TWICE A DAY AS NEEDED FOR HEADACHE 30 tablet 2  . candesartan (ATACAND) 32 MG tablet Take 1 tablet (32 mg total) by mouth daily. 90 tablet 3  . cefdinir (OMNICEF) 300 MG capsule Take 1 capsule (300 mg total) by mouth 2 (two) times daily. 20 capsule 0  . diclofenac sodium (VOLTAREN) 1 % GEL Apply 4 g topically 4 (four) times daily. 200 g 3  . gabapentin (NEURONTIN) 300 MG capsule Take 1 capsule (300 mg total) by mouth 2 (two) times daily as needed. 60 capsule 1  . hydrocortisone (ANUSOL-HC) 25 MG suppository Place 1 suppository (25 mg total) rectally 2 (two) times daily. 15 suppository 1  . prednisoLONE 5 MG TABS tablet As directed 40 each 0  . triamterene-hydrochlorothiazide (MAXZIDE-25) 37.5-25 MG tablet   7  . valACYclovir (VALTREX) 1000 MG tablet Take 1 tablet (1,000 mg total) by mouth 3 (three) times daily. Take for 10 days 30 tablet 0  . Vitamin D, Ergocalciferol, (DRISDOL) 50000 units CAPS capsule TAKE ONE CAPSULE BY MOUTH EVERY 30 DAYS 6 capsule 0   No facility-administered medications prior to visit.     ROS Review of Systems  Constitutional: Negative for activity change, appetite change, chills, fatigue and unexpected weight change.  HENT: Positive for sore throat. Negative for congestion, mouth sores and sinus pressure.   Eyes: Negative for visual disturbance.    Respiratory: Negative for cough and chest tightness.   Gastrointestinal: Negative for abdominal pain and nausea.  Genitourinary: Negative for difficulty urinating, frequency and vaginal pain.  Musculoskeletal: Negative for back pain and gait problem.  Skin: Negative for pallor and rash.  Neurological: Positive for headaches. Negative for dizziness, tremors, weakness and numbness.  Psychiatric/Behavioral: Negative for confusion, sleep disturbance and suicidal ideas. The patient is not nervous/anxious.     Objective:  BP (!) 150/100 (BP Location: Left Arm, Patient Position: Sitting, Cuff Size: Normal)   Pulse 80   Temp 98.4 F (36.9 C) (Oral)   Resp 18   Ht 5' 4.5" (1.638 m)   Wt 178 lb (80.7 kg)   LMP 08/10/2013 Comment: PMP bleeding  SpO2 99%   BMI 30.08 kg/m   BP Readings from Last 3 Encounters:  07/30/16 (!) 150/100  04/11/16 (!) 148/98  04/08/16 (!) 144/88    Wt Readings from Last 3 Encounters:  07/30/16 178 lb (80.7 kg)  04/11/16 179 lb (81.2 kg)  04/08/16 180 lb (81.6 kg)    Physical Exam  Constitutional: She appears well-developed. No distress.  HENT:  Head: Normocephalic.  Right Ear: External ear normal.  Left Ear: External ear normal.  Nose: Nose normal.  Mouth/Throat: Oropharynx is clear and moist.  Eyes: Conjunctivae are normal. Pupils are equal, round, and reactive to light. Right eye exhibits no discharge. Left eye exhibits no discharge.  Neck: Normal  range of motion. Neck supple. No JVD present. No tracheal deviation present. No thyromegaly present.  Cardiovascular: Normal rate, regular rhythm and normal heart sounds.   Pulmonary/Chest: No stridor. No respiratory distress. She has no wheezes.  Abdominal: Soft. Bowel sounds are normal. She exhibits no distension and no mass. There is no tenderness. There is no rebound and no guarding.  Musculoskeletal: She exhibits no edema or tenderness.  Lymphadenopathy:    She has no cervical adenopathy.   Neurological: She displays normal reflexes. No cranial nerve deficit. She exhibits normal muscle tone. Coordination normal.  Skin: No rash noted. No erythema.  Psychiatric: She has a normal mood and affect. Her behavior is normal. Judgment and thought content normal.  slight erythema of the soft palate on the R Slight R tonsillar adenopathy, no mass No rash Slight residual R facial droop  Lab Results  Component Value Date   WBC 4.1 02/19/2016   HGB 13.5 02/19/2016   HCT 39.8 02/19/2016   PLT 218.0 02/19/2016   GLUCOSE 97 02/19/2016   CHOL 268 (H) 02/19/2016   TRIG 172.0 (H) 02/19/2016   HDL 55.00 02/19/2016   LDLDIRECT 157.3 06/04/2012   LDLCALC 178 (H) 02/19/2016   ALT 22 02/19/2016   AST 18 02/19/2016   NA 142 02/19/2016   K 4.2 02/19/2016   CL 103 02/19/2016   CREATININE 0.67 02/19/2016   BUN 15 02/19/2016   CO2 31 02/19/2016   TSH 2.08 02/19/2016   INR 0.9 ratio 09/05/2009    No results found.  Assessment & Plan:   There are no diagnoses linked to this encounter. I am having Ms. Stemen maintain her diclofenac sodium, candesartan, albuterol, Vitamin D (Ergocalciferol), triamterene-hydrochlorothiazide, valACYclovir, hydrocortisone, gabapentin, cefdinir, prednisoLONE, and butalbital-acetaminophen-caffeine.  No orders of the defined types were placed in this encounter.    Follow-up: No Follow-up on file.  Walker Kehr, MD

## 2016-07-30 NOTE — Assessment & Plan Note (Signed)
atacand, maxzide

## 2016-08-12 ENCOUNTER — Ambulatory Visit: Payer: BLUE CROSS/BLUE SHIELD | Admitting: Internal Medicine

## 2016-08-12 ENCOUNTER — Telehealth: Payer: Self-pay

## 2016-08-12 ENCOUNTER — Other Ambulatory Visit (INDEPENDENT_AMBULATORY_CARE_PROVIDER_SITE_OTHER): Payer: BLUE CROSS/BLUE SHIELD

## 2016-08-12 DIAGNOSIS — J029 Acute pharyngitis, unspecified: Secondary | ICD-10-CM | POA: Diagnosis not present

## 2016-08-12 DIAGNOSIS — I10 Essential (primary) hypertension: Secondary | ICD-10-CM | POA: Diagnosis not present

## 2016-08-12 DIAGNOSIS — Z7721 Contact with and (suspected) exposure to potentially hazardous body fluids: Secondary | ICD-10-CM

## 2016-08-12 LAB — URINALYSIS, ROUTINE W REFLEX MICROSCOPIC
Bilirubin Urine: NEGATIVE
Hgb urine dipstick: NEGATIVE
KETONES UR: NEGATIVE
NITRITE: NEGATIVE
RBC / HPF: NONE SEEN (ref 0–?)
SPECIFIC GRAVITY, URINE: 1.01 (ref 1.000–1.030)
Total Protein, Urine: NEGATIVE
URINE GLUCOSE: NEGATIVE
Urobilinogen, UA: 0.2 (ref 0.0–1.0)
pH: 7 (ref 5.0–8.0)

## 2016-08-12 LAB — CBC WITH DIFFERENTIAL/PLATELET
BASOS ABS: 0 10*3/uL (ref 0.0–0.1)
BASOS PCT: 0.7 % (ref 0.0–3.0)
EOS ABS: 0.1 10*3/uL (ref 0.0–0.7)
Eosinophils Relative: 1.9 % (ref 0.0–5.0)
HEMATOCRIT: 39.4 % (ref 36.0–46.0)
HEMOGLOBIN: 13.2 g/dL (ref 12.0–15.0)
LYMPHS PCT: 25.6 % (ref 12.0–46.0)
Lymphs Abs: 1 10*3/uL (ref 0.7–4.0)
MCHC: 33.5 g/dL (ref 30.0–36.0)
MCV: 88.7 fl (ref 78.0–100.0)
MONO ABS: 0.4 10*3/uL (ref 0.1–1.0)
Monocytes Relative: 9.4 % (ref 3.0–12.0)
NEUTROS ABS: 2.5 10*3/uL (ref 1.4–7.7)
Neutrophils Relative %: 62.4 % (ref 43.0–77.0)
PLATELETS: 225 10*3/uL (ref 150.0–400.0)
RBC: 4.44 Mil/uL (ref 3.87–5.11)
RDW: 14.1 % (ref 11.5–15.5)
WBC: 3.9 10*3/uL — AB (ref 4.0–10.5)

## 2016-08-12 LAB — BASIC METABOLIC PANEL
BUN: 12 mg/dL (ref 6–23)
CO2: 30 mEq/L (ref 19–32)
Calcium: 10.1 mg/dL (ref 8.4–10.5)
Chloride: 103 mEq/L (ref 96–112)
Creatinine, Ser: 0.68 mg/dL (ref 0.40–1.20)
GFR: 94.66 mL/min (ref >60.00)
Glucose, Bld: 99 mg/dL (ref 70–99)
POTASSIUM: 3.9 meq/L (ref 3.5–5.1)
SODIUM: 143 meq/L (ref 135–145)

## 2016-08-12 LAB — LIPID PANEL
CHOL/HDL RATIO: 4
Cholesterol: 275 mg/dL — ABNORMAL HIGH (ref 0–200)
HDL: 65.3 mg/dL (ref >39.00)
LDL CALC: 186 mg/dL — AB (ref 0–99)
NONHDL: 209.48
TRIGLYCERIDES: 118 mg/dL (ref 0.0–149.0)
VLDL: 23.6 mg/dL (ref 0.0–40.0)

## 2016-08-12 LAB — HEPATIC FUNCTION PANEL
ALK PHOS: 66 U/L (ref 39–117)
ALT: 29 U/L (ref 0–35)
AST: 24 U/L (ref 0–37)
Albumin: 4.7 g/dL (ref 3.5–5.2)
BILIRUBIN DIRECT: 0.1 mg/dL (ref 0.0–0.3)
BILIRUBIN TOTAL: 0.6 mg/dL (ref 0.2–1.2)
TOTAL PROTEIN: 7.3 g/dL (ref 6.0–8.3)

## 2016-08-12 LAB — HIV ANTIBODY (ROUTINE TESTING W REFLEX): HIV: NONREACTIVE

## 2016-08-12 LAB — HEPATITIS C ANTIBODY: HCV AB: NEGATIVE

## 2016-08-12 LAB — TSH: TSH: 2.88 u[IU]/mL (ref 0.35–4.50)

## 2016-08-12 NOTE — Telephone Encounter (Signed)
Patient has walked in and is requesting labs to check for HIV and Hep C---patient states she was stuck by instrument at work, but does not want to report to her employer---patient understands insurance may not cover costs today and she could end up with out of pocket expense---routing to dr plotnikov---are you ok with entering labs to check for hep C and HIV and what should these labs be associated with

## 2016-11-13 ENCOUNTER — Encounter: Payer: Self-pay | Admitting: Obstetrics & Gynecology

## 2016-11-17 ENCOUNTER — Other Ambulatory Visit: Payer: Self-pay | Admitting: Internal Medicine

## 2016-11-27 ENCOUNTER — Telehealth: Payer: Self-pay | Admitting: Internal Medicine

## 2016-11-27 MED ORDER — TRIAMTERENE-HCTZ 37.5-25 MG PO TABS: 1.0000 | ORAL_TABLET | Freq: Every day | ORAL | 0 refills | Status: DC

## 2016-11-27 NOTE — Telephone Encounter (Signed)
Verified chart and pt is up-to-date sent 90 day script until CPX in Oct.../lmb

## 2016-11-27 NOTE — Telephone Encounter (Signed)
Patient requesting refill on triamterene hydrochlorothiazide.  Patient states she uses pharmacy on file.

## 2017-01-09 ENCOUNTER — Other Ambulatory Visit: Payer: Self-pay | Admitting: Internal Medicine

## 2017-02-12 ENCOUNTER — Telehealth: Payer: Self-pay | Admitting: Internal Medicine

## 2017-02-12 NOTE — Telephone Encounter (Signed)
Ok to w/in tomorrow if needed [employer]

## 2017-02-12 NOTE — Telephone Encounter (Signed)
Seeing Plotnikov tomorrow

## 2017-02-12 NOTE — Telephone Encounter (Signed)
Please advise 

## 2017-02-12 NOTE — Telephone Encounter (Signed)
Patient Name: Sarah Waller  DOB: 03-08-1961    Initial Comment Caller has hx of Bell's palsy, now has numbness on right side of mouth/tongue.   Nurse Assessment  Nurse: Raphael Gibney, RN, Vanita Ingles Date/Time (Eastern Time): 02/12/2017 10:38:39 AM  Confirm and document reason for call. If symptomatic, describe symptoms. ---Caller states she has history of Bell's palsy last November. No facial drooping. Right side of mouth and right side of tongue is numb. Pain around right ear and right jaw.  Does the patient have any new or worsening symptoms? ---Yes  Will a triage be completed? ---Yes  Related visit to physician within the last 2 weeks? ---No  Does the PT have any chronic conditions? (i.e. diabetes, asthma, etc.) ---Yes  List chronic conditions. ---history of Bell's palsy  Is this a behavioral health or substance abuse call? ---No     Guidelines    Guideline Title Affirmed Question Affirmed Notes  Neurologic Deficit Bell's palsy suspected (i.e., weakness on only one side of the face, developing over hours to days, no other symptoms)    Final Disposition User   Go to ED Now (or PCP triage) Raphael Gibney, RN, Vanita Ingles    Comments  No appts available with Dr. Alain Marion at [facility] does not want to go to urgent care, ER or see another provider.  called office and spoke to [facility] and gave report that pt has history of Bell's palsy. She is having numbness on the right side of her mouth and tongue Triage outcome of go to ER now (or PCP triage). No appts available wtih Dr. Liberty Handy. She does not want to see another provider.   Referrals  GO TO FACILITY REFUSED   Caller Disagree/Comply Disagree  Caller Understands Yes  PreDisposition Call Doctor

## 2017-02-12 NOTE — Telephone Encounter (Signed)
Pt would like to be worked in today, she is having numbness on her right side of her month and tongue, I am sending her to team health nurse to make sure it is not a ER visit   Best number [phone]

## 2017-02-13 ENCOUNTER — Encounter: Payer: Self-pay | Admitting: Internal Medicine

## 2017-02-13 ENCOUNTER — Ambulatory Visit (INDEPENDENT_AMBULATORY_CARE_PROVIDER_SITE_OTHER): Payer: BLUE CROSS/BLUE SHIELD | Admitting: Internal Medicine

## 2017-02-13 DIAGNOSIS — G4489 Other headache syndrome: Secondary | ICD-10-CM | POA: Diagnosis not present

## 2017-02-13 DIAGNOSIS — R42 Dizziness and giddiness: Secondary | ICD-10-CM

## 2017-02-13 MED ORDER — VITAMIN D (ERGOCALCIFEROL) 1.25 MG (50000 UNIT) PO CAPS: ORAL_CAPSULE | ORAL | 3 refills | Status: DC

## 2017-02-13 MED ORDER — VALACYCLOVIR HCL 1 G PO TABS: 1000.0000 mg | ORAL_TABLET | Freq: Three times a day (TID) | ORAL | 0 refills | Status: DC

## 2017-02-13 MED ORDER — METHYLPREDNISOLONE 4 MG PO TBPK: ORAL_TABLET | ORAL | 0 refills | Status: DC

## 2017-02-13 NOTE — Patient Instructions (Signed)
Take Advil 400 mg 2-3 times a day with food

## 2017-02-13 NOTE — Telephone Encounter (Signed)
Pt has an appt today.  

## 2017-02-13 NOTE — Progress Notes (Signed)
Subjective:  Patient ID: Sarah Waller, female    DOB: [birth year]  Age: 56 y.o. MRN: [id number]  CC: No chief complaint on file.   HPI Sarah Waller presents for R earache and tongue tip tingling yesterday. It felt like her previouse Bell's palsy. Took Advil. Felt dizzy - left work. Better this am  Outpatient Medications Prior to Visit  Medication Sig Dispense Refill  . albuterol (PROAIR HFA) 108 (90 Base) MCG/ACT inhaler Inhale 2 puffs into the lungs every 6 (six) hours as needed for wheezing or shortness of breath. 1 Inhaler 6  . butalbital-acetaminophen-caffeine (FIORICET, ESGIC) 50-325-40 MG tablet TAKE ONE TO TWO TABLETS BY MOUTH TWICE A DAY AS NEEDED FOR HEADACHE 30 tablet 2  . candesartan (ATACAND) 32 MG tablet Take 1 tablet (32 mg total) by mouth daily. 90 tablet 3  . cefdinir (OMNICEF) 300 MG capsule Take 1 capsule (300 mg total) by mouth 2 (two) times daily. 20 capsule 0  . diclofenac sodium (VOLTAREN) 1 % GEL Apply 4 g topically 4 (four) times daily. 200 g 3  . hydrocortisone (ANUSOL-HC) 25 MG suppository Place 1 suppository (25 mg total) rectally 2 (two) times daily. 15 suppository 1  . prednisoLONE 5 MG TABS tablet As directed 40 each 0  . triamterene-hydrochlorothiazide (MAXZIDE-25) 37.5-25 MG tablet Take 1 tablet by mouth daily. Annual appt due in OCT must see provider for future refills. 90 tablet 0  . valACYclovir (VALTREX) 1000 MG tablet Take 1 tablet (1,000 mg total) by mouth 3 (three) times daily. Take for 10 days 30 tablet 0  . Vitamin D, Ergocalciferol, (DRISDOL) 50000 units CAPS capsule TAKE ONE CAPSULE BY MOUTH EVERY 30 DAYS 6 capsule 0  . gabapentin (NEURONTIN) 300 MG capsule Take 1 capsule (300 mg total) by mouth 2 (two) times daily as needed. 60 capsule 1   No facility-administered medications prior to visit.     ROS Review of Systems  Constitutional: Negative for activity change, appetite change, chills, fatigue and unexpected weight change.  HENT:  Negative for congestion, mouth sores and sinus pressure.   Eyes: Negative for visual disturbance.  Respiratory: Negative for cough and chest tightness.   Gastrointestinal: Negative for abdominal pain and nausea.  Genitourinary: Negative for difficulty urinating, frequency and vaginal pain.  Musculoskeletal: Negative for back pain and gait problem.  Skin: Negative for pallor and rash.  Neurological: Positive for headaches. Negative for dizziness, tremors, facial asymmetry, weakness, light-headedness and numbness.  Psychiatric/Behavioral: Negative for confusion and sleep disturbance.    Objective:  BP (!) 142/98 (BP Location: Right Arm, Patient Position: Sitting, Cuff Size: Normal)   Pulse 77   Temp 98.7 F (37.1 C) (Oral)   Resp 16   Ht 5' 4.5" (1.638 m)   Wt 174 lb (78.9 kg)   LMP 08/10/2013 Comment: PMP bleeding  SpO2 97%   BMI 29.41 kg/m   BP Readings from Last 3 Encounters:  02/13/17 (!) 142/98  07/30/16 (!) 150/100  04/11/16 (!) 148/98    Wt Readings from Last 3 Encounters:  02/13/17 174 lb (78.9 kg)  07/30/16 178 lb (80.7 kg)  04/11/16 179 lb (81.2 kg)    Physical Exam  Constitutional: She appears well-developed. No distress.  HENT:  Head: Normocephalic.  Right Ear: External ear normal.  Left Ear: External ear normal.  Nose: Nose normal.  Mouth/Throat: Oropharynx is clear and moist.  Eyes: Pupils are equal, round, and reactive to light. Conjunctivae are normal. Right eye exhibits no discharge. Left eye exhibits  no discharge.  Neck: Normal range of motion. Neck supple. No JVD present. No tracheal deviation present. No thyromegaly present.  Cardiovascular: Normal rate, regular rhythm and normal heart sounds.   Pulmonary/Chest: No stridor. No respiratory distress. She has no wheezes.  Abdominal: Soft. Bowel sounds are normal. She exhibits no distension and no mass. There is no tenderness. There is no rebound and no guarding.  Musculoskeletal: She exhibits no edema  or tenderness.  Lymphadenopathy:    She has no cervical adenopathy.  Neurological: She displays normal reflexes. No cranial nerve deficit. She exhibits normal muscle tone. Coordination normal.  Skin: No rash noted. No erythema.  Psychiatric: She has a normal mood and affect. Her behavior is normal. Judgment and thought content normal.    Lab Results  Component Value Date   WBC 3.9 (L) 08/12/2016   HGB 13.2 08/12/2016   HCT 39.4 08/12/2016   PLT 225.0 08/12/2016   GLUCOSE 99 08/12/2016   CHOL 275 (H) 08/12/2016   TRIG 118.0 08/12/2016   HDL 65.30 08/12/2016   LDLDIRECT 157.3 06/04/2012   LDLCALC 186 (H) 08/12/2016   ALT 29 08/12/2016   AST 24 08/12/2016   NA 143 08/12/2016   K 3.9 08/12/2016   CL 103 08/12/2016   CREATININE 0.68 08/12/2016   BUN 12 08/12/2016   CO2 30 08/12/2016   TSH 2.88 08/12/2016   INR 0.9 ratio 09/05/2009    No results found.  Assessment & Plan:   There are no diagnoses linked to this encounter. I have discontinued Ms. Throne's gabapentin. I am also having her maintain her diclofenac sodium, albuterol, Vitamin D (Ergocalciferol), valACYclovir, hydrocortisone, prednisoLONE, butalbital-acetaminophen-caffeine, cefdinir, candesartan, and triamterene-hydrochlorothiazide.  No orders of the defined types were placed in this encounter.    Follow-up: No Follow-up on file.  Walker Kehr, MD

## 2017-02-16 ENCOUNTER — Encounter: Payer: Self-pay | Admitting: Internal Medicine

## 2017-02-16 NOTE — Assessment & Plan Note (Signed)
Resolving BPV most likely

## 2017-02-16 NOTE — Assessment & Plan Note (Addendum)
Possible trigeminal neuralgia R.  Doubt Bell's palsy. If worse - Valtrex and dose-pack of Medrol Ibuprofen 400 mg prn

## 2017-03-18 ENCOUNTER — Other Ambulatory Visit: Payer: Self-pay | Admitting: Obstetrics & Gynecology

## 2017-03-18 ENCOUNTER — Ambulatory Visit
Admission: RE | Admit: 2017-03-18 | Discharge: 2017-03-18 | Disposition: A | Discharge status: Home / Self Care | Payer: BLUE CROSS/BLUE SHIELD | Source: Ambulatory Visit | Attending: Obstetrics & Gynecology | Admitting: Obstetrics & Gynecology

## 2017-03-18 DIAGNOSIS — Z1231 Encounter for screening mammogram for malignant neoplasm of breast: Secondary | ICD-10-CM

## 2017-03-18 HISTORY — DX: Personal history of irradiation: Z92.3

## 2017-04-13 ENCOUNTER — Other Ambulatory Visit: Payer: Self-pay

## 2017-04-13 ENCOUNTER — Encounter: Payer: Self-pay | Admitting: Obstetrics & Gynecology

## 2017-04-13 ENCOUNTER — Ambulatory Visit (INDEPENDENT_AMBULATORY_CARE_PROVIDER_SITE_OTHER): Payer: BLUE CROSS/BLUE SHIELD | Admitting: Obstetrics & Gynecology

## 2017-04-13 ENCOUNTER — Other Ambulatory Visit (HOSPITAL_COMMUNITY)
Admission: RE | Admit: 2017-04-13 | Discharge: 2017-04-13 | Disposition: A | Discharge status: Home / Self Care | Payer: BLUE CROSS/BLUE SHIELD | Source: Ambulatory Visit | Attending: Obstetrics & Gynecology | Admitting: Obstetrics & Gynecology

## 2017-04-13 VITALS — BP 128/86 | HR 78 | Resp 14 | Ht 66.0 in | Wt 178.0 lb

## 2017-04-13 DIAGNOSIS — Z124 Encounter for screening for malignant neoplasm of cervix: Secondary | ICD-10-CM | POA: Diagnosis present

## 2017-04-13 DIAGNOSIS — Z01419 Encounter for gynecological examination (general) (routine) without abnormal findings: Secondary | ICD-10-CM | POA: Diagnosis not present

## 2017-04-13 DIAGNOSIS — E559 Vitamin D deficiency, unspecified: Secondary | ICD-10-CM | POA: Diagnosis not present

## 2017-04-13 MED ORDER — GABAPENTIN 100 MG PO CAPS: ORAL_CAPSULE | ORAL | 0 refills | Status: DC

## 2017-04-13 NOTE — Addendum Note (Signed)
Addended by: Megan Salon on: 04/13/2017 02:42 PM   Modules accepted: Orders

## 2017-04-13 NOTE — Progress Notes (Addendum)
56 y.o. G4P1 Married Caucasian F here for annual exam.  Doing well.  Planning on moving to their home nearly [place] in a year.  Son is working in [place].  Stressors are better.  Bell's palsy has fully resolved.     Denies vaginal bleeding.    Patient's last menstrual period was 08/10/2013.          Sexually active: No.  The current method of family planning is post menopausal status.  Exercising: Yes.    walking Smoker:  no  Health Maintenance: Pap:  04/09/16 negative, HR HPV negative, 12/08/14 negative  History of abnormal Pap:  no MMG:  03/18/17 BIRADS 1 negative  Colonoscopy:  09/18/08 normal, repeat 10 years BMD:   09/15/12 osteopenia   TDaP:  05/03/13  Pneumonia vaccine(s):  never Zostavax:   Never.   Hep C testing: 08/12/16 negative  Screening Labs: PCP, Hb today: PCP, Urine today: not collected   reports that  has never smoked. she has never used smokeless tobacco. She reports that she does not drink alcohol or use drugs.  Past Medical History:  Diagnosis Date  . Abnormal weight gain   . Allergic rhinitis, cause unspecified   . Bell palsy   . Cervicalgia   . Cystitis, unspecified   . Depression   . Diaphragmatic hernia without mention of obstruction or gangrene   . Dizziness and giddiness   . Esophageal reflux   . Flatulence, eructation, and gas pain   . Generalized hyperhidrosis   . Headache(784.0)   . Irritable bowel syndrome   . Lumbago   . Malignant neoplasm of breast (female), unspecified site    Breast Cancer 2008  . Migraine without aura, with intractable migraine, so stated, without mention of status migrainosus   . Nonspecific abnormal electrocardiogram (ECG) (EKG)   . Other specified disorder of rectum and anus   . Pain in joint, shoulder region   . Pain in limb   . Painful respiration   . Paroxysmal tachycardia, unspecified ([place])   . Peptic ulcer, unspecified site, unspecified as acute or chronic, without mention of hemorrhage, perforation, or  obstruction   . Personal history of contact with and (suspected) exposure to potentially hazardous body fluids   . Personal history of malignant neoplasm of breast   . Personal history of radiation therapy   . Rash and other nonspecific skin eruption   . Unspecified asthma(493.90)   . Vomiting alone     Past Surgical History:  Procedure Laterality Date  . BREAST LUMPECTOMY    . HYSTEROSCOPY W/D&C    . left leg tumor removal  2002   benign  . LIPOMA EXCISION  2002   left shoulder  . teeth implant  7/09   2 teeth  . TUMOR REMOVAL     subcutaneous of right thigh    Current Outpatient Medications  Medication Sig Dispense Refill  . butalbital-acetaminophen-caffeine (FIORICET, ESGIC) 50-325-40 MG tablet TAKE ONE TO TWO TABLETS BY MOUTH TWICE A DAY AS NEEDED FOR HEADACHE 30 tablet 2  . candesartan (ATACAND) 32 MG tablet Take 1 tablet (32 mg total) by mouth daily. 90 tablet 3  . triamterene-hydrochlorothiazide (MAXZIDE-25) 37.5-25 MG tablet Take 1 tablet by mouth daily. Annual appt due in OCT must see provider for future refills. 90 tablet 0  . Vitamin D, Ergocalciferol, (DRISDOL) 50000 units CAPS capsule TAKE ONE CAPSULE BY MOUTH EVERY 30 DAYS 6 capsule 3  . albuterol (PROAIR HFA) 108 (90 Base) MCG/ACT inhaler Inhale 2  puffs into the lungs every 6 (six) hours as needed for wheezing or shortness of breath. 1 Inhaler 6   No current facility-administered medications for this visit.     Family History  Problem Relation Age of Onset  . COPD Mother   . Diabetes Mother   . Heart disease Mother   . Hypertension Mother   . Hypertension Other   . Colon cancer Neg Hx     ROS:  Pertinent items are noted in HPI.  Otherwise, a comprehensive ROS was negative.  Exam:   BP 128/86 (BP Location: Right Arm, Patient Position: Sitting, Cuff Size: Normal)   Pulse 78   Resp 14   Ht 5\' 6"  (1.676 m)   Wt 178 lb (80.7 kg)   LMP 08/10/2013 Comment: PMP bleeding  BMI 28.73 kg/m   Weight change: -4#    Height: 5\' 6"  (167.6 cm)  Ht Readings from Last 3 Encounters:  04/13/17 5\' 6"  (1.676 m)  02/13/17 5' 4.5" (1.638 m)  07/30/16 5' 4.5" (1.638 m)    General appearance: alert, cooperative and appears stated age Head: Normocephalic, without obvious abnormality, atraumatic Neck: no adenopathy, supple, symmetrical, trachea midline and thyroid normal to inspection and palpation Lungs: clear to auscultation bilaterally Breasts: left breast with well healed scars, no masses, minimal radiation changes, no LAD; right breast without masses, skin changes, nipple discharge, LAD Heart: regular rate and rhythm Abdomen: soft, non-tender; bowel sounds normal; no masses,  no organomegaly Extremities: extremities normal, atraumatic, no cyanosis or edema Skin: Skin color, texture, turgor normal. No rashes or lesions Lymph nodes: Cervical, supraclavicular, and axillary nodes normal. No abnormal inguinal nodes palpated Neurologic: Grossly normal   Pelvic: External genitalia:  no lesions              Urethra:  normal appearing urethra with no masses, tenderness or lesions              Bartholins and Skenes: normal                 Vagina: normal appearing vagina with normal color and discharge, no lesions              Cervix: no lesions              Pap taken: Yes.   Bimanual Exam:  Uterus:  normal size, contour, position, consistency, mobility, non-tender              Adnexa: normal adnexa and no mass, fullness, tenderness               Rectovaginal: Confirms               Anus:  normal sphincter tone, no lesions  Chaperone was present for exam.  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  Took tamoxifen for 7 years.  Declines oncology follow-up.   H/O migraines, much improved since menopause H/O Ashkenazi Jewish heritage H/O depression Not flashes  P:   Mammogram yearly.  Every other year MRI has been recommended.  Continues to decline due to  cost.   Pap smear only obtained today Lab work is UTD with Dr. Alain Marion Trial of Neurontin will titrate up to 300mg  nightly.   Vaccinations are UTD Vit D level obtained.  On 50K monthly. Follow up yearly or prn

## 2017-04-13 NOTE — Addendum Note (Signed)
Addended by: Megan Salon on: 04/13/2017 02:51 PM   Modules accepted: Orders

## 2017-04-14 LAB — CYTOLOGY - PAP: Diagnosis: NEGATIVE

## 2017-04-14 LAB — VITAMIN D 25 HYDROXY (VIT D DEFICIENCY, FRACTURES): VIT D 25 HYDROXY: 21.1 ng/mL — AB (ref 30.0–100.0)

## 2017-05-02 ENCOUNTER — Other Ambulatory Visit: Payer: Self-pay | Admitting: Internal Medicine

## 2017-06-27 ENCOUNTER — Ambulatory Visit (HOSPITAL_COMMUNITY)
Admission: EM | Admit: 2017-06-27 | Discharge: 2017-06-27 | Disposition: A | Discharge status: Home / Self Care | Payer: BLUE CROSS/BLUE SHIELD | Attending: Family Medicine | Admitting: Family Medicine

## 2017-06-27 ENCOUNTER — Other Ambulatory Visit: Payer: Self-pay

## 2017-06-27 ENCOUNTER — Encounter (HOSPITAL_COMMUNITY): Payer: Self-pay | Admitting: *Deleted

## 2017-06-27 DIAGNOSIS — T300 Burn of unspecified body region, unspecified degree: Secondary | ICD-10-CM | POA: Diagnosis not present

## 2017-06-27 MED ORDER — SILVER SULFADIAZINE 1 % EX CREA
TOPICAL_CREAM | Freq: Once | CUTANEOUS | Status: AC
  Administered 2017-06-27: 20:00:00 via TOPICAL

## 2017-06-27 MED ORDER — SILVER SULFADIAZINE 1 % EX CREA: 1.0000 "application " | TOPICAL_CREAM | Freq: Two times a day (BID) | CUTANEOUS | 0 refills | Status: DC
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
## 2017-07-09 ENCOUNTER — Ambulatory Visit (INDEPENDENT_AMBULATORY_CARE_PROVIDER_SITE_OTHER): Payer: BLUE CROSS/BLUE SHIELD | Admitting: Internal Medicine

## 2017-07-09 ENCOUNTER — Encounter: Payer: Self-pay | Admitting: Internal Medicine

## 2017-07-09 DIAGNOSIS — T2125XS Burn of second degree of buttock, sequela: Secondary | ICD-10-CM | POA: Diagnosis not present

## 2017-07-09 DIAGNOSIS — M5441 Lumbago with sciatica, right side: Secondary | ICD-10-CM | POA: Diagnosis not present

## 2017-07-09 MED ORDER — OLMESARTAN MEDOXOMIL 40 MG PO TABS: 40.0000 mg | ORAL_TABLET | Freq: Every day | ORAL | 3 refills | Status: DC

## 2017-07-09 MED ORDER — TRAMADOL HCL 50 MG PO TABS
50.0000 mg | ORAL_TABLET | Freq: Four times a day (QID) | ORAL | 0 refills | Status: DC | PRN
Start: 2017-07-09 — End: 2020-02-24
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
## 2017-07-12 DIAGNOSIS — T2125XS Burn of second degree of buttock, sequela: Secondary | ICD-10-CM | POA: Insufficient documentation
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
## 2017-08-27 ENCOUNTER — Ambulatory Visit (INDEPENDENT_AMBULATORY_CARE_PROVIDER_SITE_OTHER)
Admission: RE | Admit: 2017-08-27 | Discharge: 2017-08-27 | Disposition: A | Discharge status: Home / Self Care | Payer: BLUE CROSS/BLUE SHIELD | Source: Ambulatory Visit | Attending: Internal Medicine | Admitting: Internal Medicine

## 2017-08-27 ENCOUNTER — Encounter: Payer: Self-pay | Admitting: Neurology

## 2017-08-27 ENCOUNTER — Ambulatory Visit (INDEPENDENT_AMBULATORY_CARE_PROVIDER_SITE_OTHER): Payer: BLUE CROSS/BLUE SHIELD | Admitting: Internal Medicine

## 2017-08-27 ENCOUNTER — Encounter: Payer: Self-pay | Admitting: Internal Medicine

## 2017-08-27 DIAGNOSIS — M5416 Radiculopathy, lumbar region: Secondary | ICD-10-CM

## 2017-08-27 DIAGNOSIS — T2125XS Burn of second degree of buttock, sequela: Secondary | ICD-10-CM | POA: Diagnosis not present

## 2017-08-27 DIAGNOSIS — R208 Other disturbances of skin sensation: Secondary | ICD-10-CM

## 2017-08-27 MED ORDER — GABAPENTIN 100 MG PO CAPS: ORAL_CAPSULE | ORAL | 1 refills | Status: DC
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
## 2017-09-14 ENCOUNTER — Ambulatory Visit: Payer: BLUE CROSS/BLUE SHIELD

## 2017-09-22 ENCOUNTER — Ambulatory Visit: Payer: BLUE CROSS/BLUE SHIELD | Admitting: Internal Medicine

## 2017-09-28 ENCOUNTER — Other Ambulatory Visit: Payer: Self-pay | Admitting: Internal Medicine

## 2017-09-28 MED ORDER — CANDESARTAN CILEXETIL 32 MG PO TABS: 32.0000 mg | ORAL_TABLET | Freq: Every day | ORAL | 3 refills | Status: DC

## 2017-11-02 ENCOUNTER — Ambulatory Visit (INDEPENDENT_AMBULATORY_CARE_PROVIDER_SITE_OTHER): Payer: BLUE CROSS/BLUE SHIELD | Admitting: Internal Medicine

## 2017-11-02 ENCOUNTER — Encounter: Payer: Self-pay | Admitting: Internal Medicine

## 2017-11-02 VITALS — BP 118/78 | HR 70 | Temp 98.0°F | Ht 66.0 in | Wt 179.0 lb

## 2017-11-02 DIAGNOSIS — T2125XS Burn of second degree of buttock, sequela: Secondary | ICD-10-CM

## 2017-11-02 DIAGNOSIS — I10 Essential (primary) hypertension: Secondary | ICD-10-CM

## 2017-11-02 DIAGNOSIS — R7309 Other abnormal glucose: Secondary | ICD-10-CM

## 2017-11-02 DIAGNOSIS — Z0000 Encounter for general adult medical examination without abnormal findings: Secondary | ICD-10-CM | POA: Diagnosis not present

## 2017-11-02 DIAGNOSIS — E559 Vitamin D deficiency, unspecified: Secondary | ICD-10-CM

## 2017-11-02 NOTE — Assessment & Plan Note (Signed)
We discussed age appropriate health related issues, including available/recomended screening tests and vaccinations. We discussed a need for adhering to healthy diet and exercise. Labs/EKG were reviewed/ordered. All questions were answered.  Colonoscopy is up to date
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
## 2017-11-02 NOTE — Assessment & Plan Note (Signed)
Unable to sit >15 min due to pain in the R buttock area - not better - poor sleep

## 2017-11-02 NOTE — Assessment & Plan Note (Signed)
Atacand, Maxzide

## 2017-11-02 NOTE — Assessment & Plan Note (Signed)
Exacerbation 8/13, 5/14  Potential benefits of a short term steroid  use as well as potential risks  and complications were explained to the patient and were aknowledged.

## 2017-11-03 ENCOUNTER — Other Ambulatory Visit (INDEPENDENT_AMBULATORY_CARE_PROVIDER_SITE_OTHER): Payer: BLUE CROSS/BLUE SHIELD

## 2017-11-03 DIAGNOSIS — Z0000 Encounter for general adult medical examination without abnormal findings: Secondary | ICD-10-CM | POA: Diagnosis not present

## 2017-11-03 DIAGNOSIS — R7309 Other abnormal glucose: Secondary | ICD-10-CM | POA: Diagnosis not present

## 2017-11-03 DIAGNOSIS — E559 Vitamin D deficiency, unspecified: Secondary | ICD-10-CM

## 2017-11-03 LAB — LIPID PANEL
CHOL/HDL RATIO: 4
CHOLESTEROL: 241 mg/dL — AB (ref 0–200)
HDL: 56.5 mg/dL (ref >39.00)
LDL CALC: 160 mg/dL — AB (ref 0–99)
NonHDL: 184.81
TRIGLYCERIDES: 125 mg/dL (ref 0.0–149.0)
VLDL: 25 mg/dL (ref 0.0–40.0)

## 2017-11-03 LAB — HEPATIC FUNCTION PANEL
ALK PHOS: 57 U/L (ref 39–117)
ALT: 22 U/L (ref 0–35)
AST: 18 U/L (ref 0–37)
Albumin: 4.2 g/dL (ref 3.5–5.2)
BILIRUBIN DIRECT: 0.1 mg/dL (ref 0.0–0.3)
Total Bilirubin: 0.4 mg/dL (ref 0.2–1.2)
Total Protein: 6.8 g/dL (ref 6.0–8.3)

## 2017-11-03 LAB — CBC WITH DIFFERENTIAL/PLATELET
BASOS ABS: 0 10*3/uL (ref 0.0–0.1)
BASOS PCT: 0.9 % (ref 0.0–3.0)
EOS ABS: 0.1 10*3/uL (ref 0.0–0.7)
Eosinophils Relative: 1.7 % (ref 0.0–5.0)
HEMATOCRIT: 37.1 % (ref 36.0–46.0)
Hemoglobin: 12.5 g/dL (ref 12.0–15.0)
LYMPHS PCT: 29.5 % (ref 12.0–46.0)
Lymphs Abs: 1.4 10*3/uL (ref 0.7–4.0)
MCHC: 33.7 g/dL (ref 30.0–36.0)
MCV: 88.5 fl (ref 78.0–100.0)
MONO ABS: 0.4 10*3/uL (ref 0.1–1.0)
Monocytes Relative: 8.4 % (ref 3.0–12.0)
NEUTROS ABS: 2.8 10*3/uL (ref 1.4–7.7)
Neutrophils Relative %: 59.5 % (ref 43.0–77.0)
PLATELETS: 206 10*3/uL (ref 150.0–400.0)
RBC: 4.19 Mil/uL (ref 3.87–5.11)
RDW: 14.1 % (ref 11.5–15.5)
WBC: 4.6 10*3/uL (ref 4.0–10.5)

## 2017-11-03 LAB — URINALYSIS
BILIRUBIN URINE: NEGATIVE
HGB URINE DIPSTICK: NEGATIVE
KETONES UR: NEGATIVE
LEUKOCYTES UA: NEGATIVE
Nitrite: NEGATIVE
Specific Gravity, Urine: 1.01 (ref 1.000–1.030)
Total Protein, Urine: NEGATIVE
URINE GLUCOSE: NEGATIVE
UROBILINOGEN UA: 0.2 (ref 0.0–1.0)
pH: 7.5 (ref 5.0–8.0)

## 2017-11-03 LAB — HEMOGLOBIN A1C: HEMOGLOBIN A1C: 5.8 % (ref 4.6–6.5)

## 2017-11-03 LAB — BASIC METABOLIC PANEL
BUN: 16 mg/dL (ref 6–23)
CHLORIDE: 106 meq/L (ref 96–112)
CO2: 29 meq/L (ref 19–32)
CREATININE: 0.64 mg/dL (ref 0.40–1.20)
Calcium: 9.6 mg/dL (ref 8.4–10.5)
GFR: 101.08 mL/min (ref >60.00)
Glucose, Bld: 98 mg/dL (ref 70–99)
POTASSIUM: 4.5 meq/L (ref 3.5–5.1)
Sodium: 141 mEq/L (ref 135–145)

## 2017-11-03 LAB — VITAMIN D 25 HYDROXY (VIT D DEFICIENCY, FRACTURES): VITD: 30.11 ng/mL (ref 30.00–100.00)

## 2017-11-03 LAB — TSH: TSH: 3.97 u[IU]/mL (ref 0.35–4.50)

## 2017-11-05 ENCOUNTER — Other Ambulatory Visit: Payer: Self-pay | Admitting: Internal Medicine

## 2017-11-05 MED ORDER — VITAMIN D3 50 MCG (2000 UT) PO CAPS: 2000.0000 [IU] | ORAL_CAPSULE | Freq: Every day | ORAL | 3 refills | Status: DC

## 2017-12-21 ENCOUNTER — Ambulatory Visit (INDEPENDENT_AMBULATORY_CARE_PROVIDER_SITE_OTHER): Payer: PRIVATE HEALTH INSURANCE | Admitting: Psychology

## 2017-12-21 DIAGNOSIS — F430 Acute stress reaction: Secondary | ICD-10-CM

## 2017-12-23 ENCOUNTER — Ambulatory Visit: Payer: BLUE CROSS/BLUE SHIELD

## 2017-12-28 ENCOUNTER — Ambulatory Visit: Payer: BLUE CROSS/BLUE SHIELD | Admitting: Neurology

## 2018-01-07 ENCOUNTER — Ambulatory Visit (INDEPENDENT_AMBULATORY_CARE_PROVIDER_SITE_OTHER): Payer: PRIVATE HEALTH INSURANCE | Admitting: Psychology

## 2018-01-07 DIAGNOSIS — F430 Acute stress reaction: Secondary | ICD-10-CM | POA: Diagnosis not present

## 2018-01-14 ENCOUNTER — Ambulatory Visit (INDEPENDENT_AMBULATORY_CARE_PROVIDER_SITE_OTHER): Payer: PRIVATE HEALTH INSURANCE | Admitting: Psychology

## 2018-01-14 DIAGNOSIS — F430 Acute stress reaction: Secondary | ICD-10-CM

## 2018-01-21 ENCOUNTER — Ambulatory Visit: Payer: Self-pay | Admitting: Psychology

## 2018-01-28 ENCOUNTER — Ambulatory Visit (INDEPENDENT_AMBULATORY_CARE_PROVIDER_SITE_OTHER): Payer: PRIVATE HEALTH INSURANCE | Admitting: Psychology

## 2018-01-28 DIAGNOSIS — F321 Major depressive disorder, single episode, moderate: Secondary | ICD-10-CM | POA: Diagnosis not present

## 2018-02-01 ENCOUNTER — Ambulatory Visit (INDEPENDENT_AMBULATORY_CARE_PROVIDER_SITE_OTHER): Payer: BLUE CROSS/BLUE SHIELD | Admitting: Neurology

## 2018-02-01 ENCOUNTER — Encounter: Payer: Self-pay | Admitting: Neurology

## 2018-02-01 VITALS — BP 100/70 | HR 82 | Ht 66.0 in | Wt 176.2 lb

## 2018-02-01 DIAGNOSIS — M5416 Radiculopathy, lumbar region: Secondary | ICD-10-CM | POA: Diagnosis not present

## 2018-02-01 DIAGNOSIS — R292 Abnormal reflex: Secondary | ICD-10-CM | POA: Diagnosis not present

## 2018-02-01 MED ORDER — GABAPENTIN 300 MG PO CAPS: 300.0000 mg | ORAL_CAPSULE | Freq: Every day | ORAL | 5 refills | Status: DC

## 2018-02-01 MED ORDER — GABAPENTIN 100 MG PO CAPS: ORAL_CAPSULE | ORAL | 5 refills | Status: DC
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
## 2018-02-01 NOTE — Patient Instructions (Addendum)
Start gabapentin 100mg  in the morning and 300mg  at bedtime  MRI brain lumbar spine without contrast  Start physiotherapy for low back pain  Return to clinic in 4 months

## 2018-02-04 ENCOUNTER — Ambulatory Visit (INDEPENDENT_AMBULATORY_CARE_PROVIDER_SITE_OTHER): Payer: PRIVATE HEALTH INSURANCE | Admitting: Psychology

## 2018-02-04 DIAGNOSIS — F430 Acute stress reaction: Secondary | ICD-10-CM

## 2018-02-09 ENCOUNTER — Other Ambulatory Visit: Payer: Self-pay | Admitting: Internal Medicine

## 2018-02-11 ENCOUNTER — Ambulatory Visit (INDEPENDENT_AMBULATORY_CARE_PROVIDER_SITE_OTHER): Payer: PRIVATE HEALTH INSURANCE | Admitting: Psychology

## 2018-02-11 DIAGNOSIS — F321 Major depressive disorder, single episode, moderate: Secondary | ICD-10-CM | POA: Diagnosis not present

## 2018-02-15 ENCOUNTER — Encounter

## 2018-02-15 ENCOUNTER — Ambulatory Visit (INDEPENDENT_AMBULATORY_CARE_PROVIDER_SITE_OTHER): Payer: BLUE CROSS/BLUE SHIELD | Admitting: Internal Medicine

## 2018-02-15 ENCOUNTER — Encounter: Payer: Self-pay | Admitting: Internal Medicine

## 2018-02-15 DIAGNOSIS — F419 Anxiety disorder, unspecified: Secondary | ICD-10-CM | POA: Diagnosis not present

## 2018-02-15 MED ORDER — ESCITALOPRAM OXALATE 5 MG PO TABS: 5.0000 mg | ORAL_TABLET | Freq: Every day | ORAL | 5 refills | Status: DC

## 2018-02-15 NOTE — Assessment & Plan Note (Signed)
Start Lexapro low dose F/u w/Psychology

## 2018-02-15 NOTE — Progress Notes (Signed)
Subjective:  Patient ID: Sarah Waller, female    DOB: 04-29-1961  Age: 57 y.o. MRN: [id number]  CC: No chief complaint on file.   HPI Sarah Waller presents for HTN C/o anxiety, fears x 2 mo -- seeing a psychologist  Outpatient Medications Prior to Visit  Medication Sig Dispense Refill  . butalbital-acetaminophen-caffeine (FIORICET, ESGIC) 50-325-40 MG tablet TAKE 1 TO 2 TABLETS BY MOUTH TWICE DAILY AS NEEDED FOR HEADACHE 30 tablet 1  . candesartan (ATACAND) 32 MG tablet Take 1 tablet (32 mg total) by mouth daily. 90 tablet 3  . Cholecalciferol (VITAMIN D3) 2000 units capsule Take 1 capsule (2,000 Units total) by mouth daily. 100 capsule 3  . gabapentin (NEURONTIN) 100 MG capsule Take 1 tablet in the morning. 45 capsule 5  . gabapentin (NEURONTIN) 300 MG capsule Take 1 capsule (300 mg total) by mouth at bedtime. 30 capsule 5  . silver sulfADIAZINE (SILVADENE) 1 % cream Apply 1 application topically 2 (two) times daily. 400 g 0  . traMADol (ULTRAM) 50 MG tablet Take 1 tablet (50 mg total) by mouth every 6 (six) hours as needed for severe pain. 28 tablet 0  . triamterene-hydrochlorothiazide (MAXZIDE-25) 37.5-25 MG tablet TAKE ONE TABLET BY MOUTH DAILY 90 tablet 2  . albuterol (PROAIR HFA) 108 (90 Base) MCG/ACT inhaler Inhale 2 puffs into the lungs every 6 (six) hours as needed for wheezing or shortness of breath. 1 Inhaler 6   No facility-administered medications prior to visit.     ROS: Review of Systems  Constitutional: Negative for activity change, appetite change, chills, fatigue and unexpected weight change.  HENT: Negative for congestion, mouth sores and sinus pressure.   Eyes: Negative for visual disturbance.  Respiratory: Negative for cough and chest tightness.   Gastrointestinal: Negative for abdominal pain and nausea.  Genitourinary: Negative for difficulty urinating, frequency and vaginal pain.  Musculoskeletal: Negative for back pain and gait problem.  Skin:  Negative for pallor and rash.  Neurological: Negative for dizziness, tremors, weakness, numbness and headaches.  Psychiatric/Behavioral: Positive for dysphoric mood. Negative for confusion, sleep disturbance and suicidal ideas. The patient is nervous/anxious.     Objective:  BP 136/90 (BP Location: Right Arm, Patient Position: Sitting, Cuff Size: Normal)   Pulse 74   Temp 98.2 F (36.8 C) (Oral)   Ht 5\' 6"  (1.676 m)   Wt 179 lb (81.2 kg)   LMP 08/10/2013 Comment: PMP bleeding  SpO2 96%   BMI 28.89 kg/m   BP Readings from Last 3 Encounters:  02/15/18 136/90  02/01/18 100/70  11/02/17 118/78    Wt Readings from Last 3 Encounters:  02/15/18 179 lb (81.2 kg)  02/01/18 176 lb 4 oz (79.9 kg)  11/02/17 179 lb (81.2 kg)    Physical Exam  Constitutional: She appears well-developed. No distress.  HENT:  Head: Normocephalic.  Right Ear: External ear normal.  Left Ear: External ear normal.  Nose: Nose normal.  Mouth/Throat: Oropharynx is clear and moist.  Eyes: Pupils are equal, round, and reactive to light. Conjunctivae are normal. Right eye exhibits no discharge. Left eye exhibits no discharge.  Neck: Normal range of motion. Neck supple. No JVD present. No tracheal deviation present. No thyromegaly present.  Cardiovascular: Normal rate, regular rhythm and normal heart sounds.  Pulmonary/Chest: No stridor. No respiratory distress. She has no wheezes.  Abdominal: Soft. Bowel sounds are normal. She exhibits no distension and no mass. There is no tenderness. There is no rebound and no guarding.  Musculoskeletal:  She exhibits no edema or tenderness.  Lymphadenopathy:    She has no cervical adenopathy.  Neurological: She displays normal reflexes. No cranial nerve deficit. She exhibits normal muscle tone. Coordination normal.  Skin: No rash noted. No erythema.  Psychiatric: Her behavior is normal. Judgment and thought content normal.  tearful   Lab Results  Component Value Date    WBC 4.6 11/03/2017   HGB 12.5 11/03/2017   HCT 37.1 11/03/2017   PLT 206.0 11/03/2017   GLUCOSE 98 11/03/2017   CHOL 241 (H) 11/03/2017   TRIG 125.0 11/03/2017   HDL 56.50 11/03/2017   LDLDIRECT 157.3 06/04/2012   LDLCALC 160 (H) 11/03/2017   ALT 22 11/03/2017   AST 18 11/03/2017   NA 141 11/03/2017   K 4.5 11/03/2017   CL 106 11/03/2017   CREATININE 0.64 11/03/2017   BUN 16 11/03/2017   CO2 29 11/03/2017   TSH 3.97 11/03/2017   INR 0.9 ratio 09/05/2009   HGBA1C 5.8 11/03/2017    Dg Lumbar Spine 2-3 Views  Result Date: 08/27/2017 CLINICAL DATA:  Lumbago with right-sided radicular symptoms EXAM: LUMBAR SPINE - 2-3 VIEW COMPARISON:  None. FINDINGS: Frontal, lateral, and spot lumbosacral lateral images were obtained. There are 4 non-rib-bearing lumbar type vertebral bodies. There are rudimentary ribs at L1. There is no fracture or spondylolisthesis. The disc spaces appear normal. No erosive change. IMPRESSION: No fracture or spondylolisthesis.  No evident arthropathy. Electronically Signed   By: Lowella Grip III M.D.   On: 08/27/2017 11:47    Assessment & Plan:   There are no diagnoses linked to this encounter.   No orders of the defined types were placed in this encounter.    Follow-up: No follow-ups on file.  Walker Kehr, MD

## 2018-02-18 ENCOUNTER — Ambulatory Visit (INDEPENDENT_AMBULATORY_CARE_PROVIDER_SITE_OTHER): Payer: PRIVATE HEALTH INSURANCE | Admitting: Psychology

## 2018-02-18 DIAGNOSIS — F321 Major depressive disorder, single episode, moderate: Secondary | ICD-10-CM

## 2018-02-25 ENCOUNTER — Ambulatory Visit (INDEPENDENT_AMBULATORY_CARE_PROVIDER_SITE_OTHER): Payer: PRIVATE HEALTH INSURANCE | Admitting: Psychology

## 2018-02-25 DIAGNOSIS — F321 Major depressive disorder, single episode, moderate: Secondary | ICD-10-CM

## 2018-03-04 ENCOUNTER — Ambulatory Visit: Payer: PRIVATE HEALTH INSURANCE | Admitting: Psychology

## 2018-03-08 ENCOUNTER — Ambulatory Visit: Payer: Self-pay | Admitting: Internal Medicine

## 2018-03-11 ENCOUNTER — Ambulatory Visit: Payer: PRIVATE HEALTH INSURANCE | Admitting: Psychology

## 2018-03-18 ENCOUNTER — Ambulatory Visit: Payer: PRIVATE HEALTH INSURANCE | Admitting: Psychology

## 2018-03-25 ENCOUNTER — Ambulatory Visit: Payer: PRIVATE HEALTH INSURANCE | Admitting: Psychology

## 2018-04-01 ENCOUNTER — Ambulatory Visit: Payer: PRIVATE HEALTH INSURANCE | Admitting: Psychology

## 2018-05-04 ENCOUNTER — Other Ambulatory Visit: Payer: Self-pay | Admitting: Internal Medicine

## 2018-05-04 DIAGNOSIS — Z780 Asymptomatic menopausal state: Secondary | ICD-10-CM

## 2018-05-20 ENCOUNTER — Ambulatory Visit (INDEPENDENT_AMBULATORY_CARE_PROVIDER_SITE_OTHER): Payer: PRIVATE HEALTH INSURANCE | Admitting: Psychology

## 2018-05-20 DIAGNOSIS — F321 Major depressive disorder, single episode, moderate: Secondary | ICD-10-CM

## 2018-05-27 ENCOUNTER — Ambulatory Visit: Payer: PRIVATE HEALTH INSURANCE | Admitting: Psychology

## 2018-06-03 ENCOUNTER — Ambulatory Visit: Payer: PRIVATE HEALTH INSURANCE | Admitting: Psychology

## 2018-06-04 ENCOUNTER — Ambulatory Visit: Payer: Self-pay | Admitting: Neurology

## 2018-06-07 ENCOUNTER — Ambulatory Visit (INDEPENDENT_AMBULATORY_CARE_PROVIDER_SITE_OTHER): Payer: PRIVATE HEALTH INSURANCE | Admitting: Psychology

## 2018-06-07 DIAGNOSIS — F321 Major depressive disorder, single episode, moderate: Secondary | ICD-10-CM

## 2018-06-10 ENCOUNTER — Ambulatory Visit: Payer: PRIVATE HEALTH INSURANCE | Admitting: Psychology

## 2018-06-17 ENCOUNTER — Ambulatory Visit: Payer: PRIVATE HEALTH INSURANCE | Admitting: Psychology

## 2018-06-24 ENCOUNTER — Ambulatory Visit: Payer: PRIVATE HEALTH INSURANCE | Admitting: Psychology

## 2018-06-28 ENCOUNTER — Ambulatory Visit: Payer: PRIVATE HEALTH INSURANCE | Admitting: Psychology

## 2018-06-28 ENCOUNTER — Encounter: Payer: Self-pay | Admitting: Obstetrics & Gynecology

## 2018-07-01 ENCOUNTER — Ambulatory Visit: Payer: PRIVATE HEALTH INSURANCE | Admitting: Psychology

## 2018-07-02 ENCOUNTER — Encounter

## 2018-07-02 ENCOUNTER — Ambulatory Visit: Payer: BLUE CROSS/BLUE SHIELD | Admitting: Obstetrics & Gynecology

## 2018-07-05 ENCOUNTER — Ambulatory Visit (INDEPENDENT_AMBULATORY_CARE_PROVIDER_SITE_OTHER): Payer: PRIVATE HEALTH INSURANCE | Admitting: Psychology

## 2018-07-05 DIAGNOSIS — F430 Acute stress reaction: Secondary | ICD-10-CM | POA: Diagnosis not present

## 2018-07-08 ENCOUNTER — Ambulatory Visit: Payer: PRIVATE HEALTH INSURANCE | Admitting: Psychology

## 2018-07-12 ENCOUNTER — Ambulatory Visit (INDEPENDENT_AMBULATORY_CARE_PROVIDER_SITE_OTHER): Payer: PRIVATE HEALTH INSURANCE | Admitting: Psychology

## 2018-07-12 DIAGNOSIS — F430 Acute stress reaction: Secondary | ICD-10-CM | POA: Diagnosis not present

## 2018-07-19 ENCOUNTER — Ambulatory Visit (INDEPENDENT_AMBULATORY_CARE_PROVIDER_SITE_OTHER): Payer: PRIVATE HEALTH INSURANCE | Admitting: Psychology

## 2018-07-19 DIAGNOSIS — F430 Acute stress reaction: Secondary | ICD-10-CM

## 2018-07-26 ENCOUNTER — Ambulatory Visit: Payer: PRIVATE HEALTH INSURANCE | Admitting: Psychology

## 2018-08-02 ENCOUNTER — Ambulatory Visit: Payer: PRIVATE HEALTH INSURANCE | Admitting: Psychology

## 2018-08-09 ENCOUNTER — Ambulatory Visit: Payer: PRIVATE HEALTH INSURANCE | Admitting: Psychology

## 2018-08-16 ENCOUNTER — Ambulatory Visit: Payer: PRIVATE HEALTH INSURANCE | Admitting: Psychology

## 2018-08-17 ENCOUNTER — Ambulatory Visit: Payer: PRIVATE HEALTH INSURANCE | Admitting: Psychology

## 2018-08-18 ENCOUNTER — Ambulatory Visit (INDEPENDENT_AMBULATORY_CARE_PROVIDER_SITE_OTHER): Payer: PRIVATE HEALTH INSURANCE | Admitting: Psychology

## 2018-08-18 DIAGNOSIS — F430 Acute stress reaction: Secondary | ICD-10-CM

## 2018-08-30 ENCOUNTER — Ambulatory Visit (INDEPENDENT_AMBULATORY_CARE_PROVIDER_SITE_OTHER): Payer: PRIVATE HEALTH INSURANCE | Admitting: Psychology

## 2018-08-30 DIAGNOSIS — F321 Major depressive disorder, single episode, moderate: Secondary | ICD-10-CM

## 2018-09-06 ENCOUNTER — Ambulatory Visit (INDEPENDENT_AMBULATORY_CARE_PROVIDER_SITE_OTHER): Payer: BLUE CROSS/BLUE SHIELD | Admitting: Psychology

## 2018-09-06 DIAGNOSIS — F321 Major depressive disorder, single episode, moderate: Secondary | ICD-10-CM | POA: Diagnosis not present

## 2018-09-13 ENCOUNTER — Ambulatory Visit (INDEPENDENT_AMBULATORY_CARE_PROVIDER_SITE_OTHER): Payer: BLUE CROSS/BLUE SHIELD | Admitting: Psychology

## 2018-09-13 DIAGNOSIS — F430 Acute stress reaction: Secondary | ICD-10-CM

## 2018-09-20 ENCOUNTER — Ambulatory Visit: Payer: PRIVATE HEALTH INSURANCE | Admitting: Psychology

## 2018-09-22 ENCOUNTER — Encounter: Payer: Self-pay | Admitting: Gastroenterology

## 2018-09-22 ENCOUNTER — Ambulatory Visit (INDEPENDENT_AMBULATORY_CARE_PROVIDER_SITE_OTHER): Payer: BLUE CROSS/BLUE SHIELD | Admitting: Psychology

## 2018-09-22 DIAGNOSIS — F331 Major depressive disorder, recurrent, moderate: Secondary | ICD-10-CM | POA: Diagnosis not present

## 2018-09-27 ENCOUNTER — Ambulatory Visit: Payer: PRIVATE HEALTH INSURANCE | Admitting: Psychology

## 2018-10-11 ENCOUNTER — Ambulatory Visit: Payer: PRIVATE HEALTH INSURANCE | Admitting: Psychology

## 2018-10-18 ENCOUNTER — Ambulatory Visit: Payer: PRIVATE HEALTH INSURANCE | Admitting: Psychology

## 2018-10-25 ENCOUNTER — Ambulatory Visit: Payer: PRIVATE HEALTH INSURANCE | Admitting: Psychology

## 2018-11-01 ENCOUNTER — Ambulatory Visit: Payer: PRIVATE HEALTH INSURANCE | Admitting: Psychology

## 2018-11-08 ENCOUNTER — Ambulatory Visit: Payer: PRIVATE HEALTH INSURANCE | Admitting: Psychology

## 2018-11-15 ENCOUNTER — Ambulatory Visit: Payer: PRIVATE HEALTH INSURANCE | Admitting: Psychology

## 2019-01-21 ENCOUNTER — Other Ambulatory Visit: Payer: Self-pay | Admitting: Internal Medicine

## 2019-01-21 NOTE — Telephone Encounter (Signed)
Pt following up on request for this med  butalbital-acetaminophen-caffeine (FIORICET, ESGIC) 50-325-40 MG tablet  This has not been prescribed since 2018. Explained to pt she will need an appt for this med, as she has not been seen since 02/2018. Pt states she she does not need appt, she only needs 2 pills because she had a bad migraine today.  Explained Dr Alain Marion not in the office,and no one else would probably fill for her without appt..  Pt states she has no insurance, and does not know why someone could not prescribe her at least 2 pills.  Kristopher Oppenheim [facility] [address], [place] [phone] (Phone) [phone] (Fax)

## 2019-03-11 ENCOUNTER — Telehealth: Payer: Self-pay

## 2019-03-11 MED ORDER — CANDESARTAN CILEXETIL 32 MG PO TABS: 32.0000 mg | ORAL_TABLET | Freq: Every day | ORAL | 3 refills | Status: DC

## 2019-03-11 NOTE — Telephone Encounter (Signed)
Printed RX for MD to sign  Copied from [place] [phone]. Topic: General - Other >> Mar 11, 2019 11:46 AM Yvette Rack wrote: Reason for CRM: Pt stated she would like to pick up the Rx for candesartan (ATACAND) 32 MG tablet because she needs to have it filled at [facility]. >> Mar 11, 2019 11:54 AM Yvette Rack wrote: Pt called back to provide Fax# [phone] for [facility]

## 2019-03-18 MED ORDER — CANDESARTAN CILEXETIL 32 MG PO TABS: 32.0000 mg | ORAL_TABLET | Freq: Every day | ORAL | 0 refills | Status: DC

## 2019-03-18 NOTE — Addendum Note (Signed)
Addended by: Karren Cobble on: 03/18/2019 01:05 PM   Modules accepted: Orders

## 2019-03-18 NOTE — Telephone Encounter (Signed)
Patient is asking that it be refaxed at [phone]. Pt states she is almost out. PT is also asking if it can also be sent to the [facility] in [place] as she does not wait to was 4-6 weeks to get it from the [facility]. Please advise.

## 2019-03-18 NOTE — Telephone Encounter (Signed)
30 day sent to local

## 2019-03-18 NOTE — Telephone Encounter (Signed)
faxed

## 2019-04-12 ENCOUNTER — Encounter: Payer: Self-pay | Admitting: Internal Medicine

## 2019-06-03 ENCOUNTER — Other Ambulatory Visit: Payer: Self-pay

## 2019-06-03 ENCOUNTER — Ambulatory Visit (INDEPENDENT_AMBULATORY_CARE_PROVIDER_SITE_OTHER): Payer: Self-pay | Admitting: Obstetrics & Gynecology

## 2019-06-03 ENCOUNTER — Encounter: Payer: Self-pay | Admitting: Obstetrics & Gynecology

## 2019-06-03 VITALS — BP 118/70 | HR 80 | Temp 96.6°F | Resp 10 | Ht 64.0 in | Wt 176.0 lb

## 2019-06-03 DIAGNOSIS — R109 Unspecified abdominal pain: Secondary | ICD-10-CM

## 2019-06-03 DIAGNOSIS — R102 Pelvic and perineal pain: Secondary | ICD-10-CM

## 2019-06-03 MED ORDER — FLUCONAZOLE 150 MG PO TABS: 150.0000 mg | ORAL_TABLET | Freq: Once | ORAL | 0 refills | Status: AC

## 2019-06-03 MED ORDER — TAMSULOSIN HCL 0.4 MG PO CAPS: 0.4000 mg | ORAL_CAPSULE | Freq: Every day | ORAL | 0 refills | Status: DC
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
## 2019-06-05 LAB — URINE CULTURE: Organism ID, Bacteria: NO GROWTH

## 2019-06-09 ENCOUNTER — Telehealth: Payer: Self-pay | Admitting: *Deleted

## 2019-06-09 NOTE — Telephone Encounter (Signed)
Patient returning call to [facility]. She can answer phone between 12-1 pm.

## 2019-06-10 NOTE — Telephone Encounter (Signed)
Spoke with patient regarding negative urine culture. Patient states that she is feeling much better. Routing to provider for final review and to close encounter.

## 2019-10-14 ENCOUNTER — Ambulatory Visit: Payer: Self-pay | Admitting: Obstetrics & Gynecology

## 2020-02-16 HISTORY — PX: CATARACT EXTRACTION: SUR2

## 2020-02-20 NOTE — Progress Notes (Signed)
59 y.o. G4P1 Married White or Caucasian female here for annual exam.  Doing well.  Denies vaginal bleeding.  Going to travel to [place], [place], with family for Christmas.  Going to travel to [place] in January.    Has moved full time to [place], [place].    Patient's last menstrual period was 08/10/2013.          Sexually active: No.  The current method of family planning is post menopausal status.    Exercising: No.  exercise Smoker:  no  Health Maintenance: Pap:  04-09-16 neg HPV HR neg, 04-13-17 neg History of abnormal Pap:  no MMG:  05-03-2018 category c density birads 2: neg, mmg scheduled Colonoscopy:  09-18-08 normal f/u 10 yrs.  Aware this is due.  She is going to schedule this.   BMD:   09-15-12 osteopenia TDaP:  2014 Pneumonia vaccine(s):  2019 Shingrix:   none Hep C testing: neg 2018 Screening Labs: has new PCP appt scheduled   reports that she has never smoked. She has never used smokeless tobacco. She reports that she does not drink alcohol and does not use drugs.  Past Medical History:  Diagnosis Date  . Allergic rhinitis, cause unspecified   . Bell palsy   . Cervicalgia   . Depression   . Diaphragmatic hernia without mention of obstruction or gangrene   . Dizziness and giddiness   . Esophageal reflux   . Irritable bowel syndrome   . Lumbago   . Malignant neoplasm of breast (female), unspecified site    Breast Cancer 2008  . Migraine without aura, with intractable migraine, so stated, without mention of status migrainosus   . Nonspecific abnormal electrocardiogram (ECG) (EKG)   . Pain in joint, shoulder region   . Paroxysmal tachycardia, unspecified ([place])   . Peptic ulcer, unspecified site, unspecified as acute or chronic, without mention of hemorrhage, perforation, or obstruction   . Personal history of contact with and (suspected) exposure to potentially hazardous body fluids   . Personal history of malignant neoplasm of breast   . Personal history of radiation  therapy   . Unspecified asthma(493.90)     Past Surgical History:  Procedure Laterality Date  . BREAST LUMPECTOMY    . CATARACT EXTRACTION Left    2021  . HYSTEROSCOPY WITH D & C    . left leg tumor removal  2002   benign  . LIPOMA EXCISION  2002   left shoulder  . teeth implant  7/09   2 teeth  . TUMOR REMOVAL     subcutaneous of right thigh    Current Outpatient Medications  Medication Sig Dispense Refill  . albuterol (PROAIR HFA) 108 (90 Base) MCG/ACT inhaler Inhale 2 puffs into the lungs every 6 (six) hours as needed for wheezing or shortness of breath. 1 Inhaler 6  . butalbital-acetaminophen-caffeine (FIORICET) 50-325-40 MG tablet TAKE ONE TO TWO TABLETS BY MOUTH TWICE DAILY AS NEEDED FOR HEADACHE. Office visit needed before refills will be given 2 tablet 0  . candesartan (ATACAND) 32 MG tablet Take 1 tablet (32 mg total) by mouth daily. 30 tablet 0  . triamterene-hydrochlorothiazide (MAXZIDE-25) 37.5-25 MG tablet TAKE ONE TABLET BY MOUTH DAILY 90 tablet 2   No current facility-administered medications for this visit.    Family History  Problem Relation Age of Onset  . COPD Mother   . Diabetes Mother   . Heart disease Mother   . Hypertension Mother   . Hypertension Other   .  Colon cancer Neg Hx     Review of Systems  Constitutional: Negative.   HENT: Negative.   Eyes: Negative.   Respiratory: Negative.   Cardiovascular: Negative.   Gastrointestinal: Negative.   Endocrine: Negative.   Genitourinary: Negative.   Musculoskeletal: Negative.   Skin: Negative.   Allergic/Immunologic: Negative.   Neurological: Negative.   Hematological: Negative.   Psychiatric/Behavioral: Negative.     Exam:   BP 118/78   Pulse 68   Resp 16   Ht 5' 5.75" (1.67 m)   Wt 181 lb (82.1 kg)   LMP 08/10/2013   BMI 29.44 kg/m   Height: 5' 5.75" (167 cm)  General appearance: alert, cooperative and appears stated age Head: Normocephalic, without obvious abnormality,  atraumatic Neck: no adenopathy, supple, symmetrical, trachea midline and thyroid normal to inspection and palpation Lungs: clear to auscultation bilaterally Breasts: right breast normal without masses, skin changes, LAD, nipple discharge; left breast with well healed and stable skin changes/scars, no masses, no nipple discharge, no LAD Heart: regular rate and rhythm Abdomen: soft, non-tender; bowel sounds normal; no masses,  no organomegaly Extremities: extremities normal, atraumatic, no cyanosis or edema Skin: Skin color, texture, turgor normal. No rashes or lesions Lymph nodes: Cervical, supraclavicular, and axillary nodes normal. No abnormal inguinal nodes palpated Neurologic: Grossly normal   Pelvic: External genitalia:  no lesions except skin tag on right inner thigh that she wants removed              Urethra:  normal appearing urethra with no masses, tenderness or lesions              Bartholins and Skenes: normal                 Vagina: normal appearing vagina with normal color and discharge, no lesions              Cervix: no lesions              Pap taken: Yes.   Bimanual Exam:  Uterus:  normal size, contour, position, consistency, mobility, non-tender              Adnexa: normal adnexa and no mass, fullness, tenderness               Rectovaginal: Confirms               Anus:  normal sphincter tone, no lesions  Procedure:  Consent obtained.  Area on right inner thigh cleansed with Betadine x 3.  1cc 1% Lidocaine without epinephrine instilled beneath lesion.  Lesion fully excised with sterile scissors.  Silver nitrate applied for excellent hemostasis.  Pt tolerated procedure well.  Dressing applied.    Chaperone, Terence Lux, CMA, was present for exam.  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  Took tamoxifen for 7 years.  She stopped seeing her oncologist prior to being fully  released. Migraines Ashkenazi Jewish heritage H/O depression Hypertension  P:         Mammogram yearly recommended. Pap and HR HPV obtained today Pt aware she is overdue for her colonoscopy.  States she will have this done by the end of the year. Plan BMD with MMG CBC, CMP, lipids (non fasting), TSH and Vit D obtained today. Vaccinations reviewed.  D/w pt shingrix vaccination.  S/p skin tag removal.  Lesion was not sent to pathology.    Follow up yearly or  prn

## 2020-02-24 ENCOUNTER — Other Ambulatory Visit: Payer: Self-pay

## 2020-02-24 ENCOUNTER — Ambulatory Visit (INDEPENDENT_AMBULATORY_CARE_PROVIDER_SITE_OTHER): Payer: BLUE CROSS/BLUE SHIELD | Admitting: Obstetrics & Gynecology

## 2020-02-24 ENCOUNTER — Telehealth: Payer: Self-pay | Admitting: *Deleted

## 2020-02-24 ENCOUNTER — Encounter: Payer: Self-pay | Admitting: Obstetrics & Gynecology

## 2020-02-24 ENCOUNTER — Other Ambulatory Visit (HOSPITAL_COMMUNITY): Admit: 2020-02-24 | Payer: Self-pay

## 2020-02-24 VITALS — BP 118/78 | HR 68 | Resp 16 | Ht 65.75 in | Wt 181.0 lb

## 2020-02-24 DIAGNOSIS — Z124 Encounter for screening for malignant neoplasm of cervix: Secondary | ICD-10-CM | POA: Diagnosis not present

## 2020-02-24 DIAGNOSIS — Z01419 Encounter for gynecological examination (general) (routine) without abnormal findings: Secondary | ICD-10-CM

## 2020-02-24 DIAGNOSIS — Z0000 Encounter for general adult medical examination without abnormal findings: Secondary | ICD-10-CM | POA: Diagnosis not present

## 2020-02-24 MED ORDER — CANDESARTAN CILEXETIL 32 MG PO TABS: 32.0000 mg | ORAL_TABLET | Freq: Every day | ORAL | 0 refills | Status: AC

## 2020-02-24 MED ORDER — ALBUTEROL SULFATE HFA 108 (90 BASE) MCG/ACT IN AERS: 2.0000 | INHALATION_SPRAY | Freq: Four times a day (QID) | RESPIRATORY_TRACT | 0 refills | Status: AC | PRN

## 2020-02-24 MED ORDER — TRIAMTERENE-HCTZ 37.5-25 MG PO TABS: 1.0000 | ORAL_TABLET | Freq: Every day | ORAL | 0 refills | Status: AC

## 2020-02-24 NOTE — Patient Instructions (Signed)
Sarah Humble, MD  [address] [facility] [address] [place], [place] [address]  OFFICE HOURS: Monday-Thursday: 8am-5pm Friday: 8am - 2pm

## 2020-02-24 NOTE — Telephone Encounter (Signed)
Call placed to [facility] at [phone], spoke with [facility].  Was advised MMG and BMD could not be scheduled in the same day.  Screening MMG scheduled for 03/28/20 at 4:20pm, arrive at 4:05 pm at [facility], [address], [place].   BMD scheduled for 04/10/20 at 3:20pm, arrive at 3:05pm. No prep. [address] [place], [place], [place].   Fax order for BMD to [phone].   Call placed to patient, left detailed message, ok per dpr. Advised of appointment details as seen above. If you need to make any changes to these appts contact [facility] directly at [phone]. If you have any additional questions please contact the office at [phone].   Order for BMD to Dr. Sabra Heck to be signed and faxed.   Encounter closed.

## 2020-02-24 NOTE — Telephone Encounter (Signed)
-----   Message from Megan Salon, MD sent at 02/24/2020 10:25 AM EDT ----- Regarding: MMG and Bone density in [place], Can you schedule a MMG and BMD for this pt at [facility] in [place], [place], right outside of [place].  [phone] phone number.  Thanks.  Vinnie Level

## 2020-02-25 LAB — COMPREHENSIVE METABOLIC PANEL
ALT: 23 IU/L (ref 0–32)
AST: 15 IU/L (ref 0–40)
Albumin/Globulin Ratio: 2 (ref 1.2–2.2)
Albumin: 4.6 g/dL (ref 3.8–4.9)
Alkaline Phosphatase: 81 IU/L (ref 44–121)
BUN/Creatinine Ratio: 24 (ref 12–28)
BUN: 17 mg/dL (ref 8–27)
Bilirubin Total: 0.4 mg/dL (ref 0.0–1.2)
CO2: 28 mmol/L (ref 20–29)
Calcium: 9.8 mg/dL (ref 8.7–10.3)
Chloride: 102 mmol/L (ref 96–106)
Creatinine, Ser: 0.71 mg/dL (ref 0.57–1.00)
GFR calc Af Amer: 107 mL/min/{1.73_m2} (ref >59)
GFR calc non Af Amer: 93 mL/min/{1.73_m2} (ref >59)
Globulin, Total: 2.3 g/dL (ref 1.5–4.5)
Glucose: 91 mg/dL (ref 65–99)
Potassium: 4.3 mmol/L (ref 3.5–5.2)
Sodium: 141 mmol/L (ref 134–144)
Total Protein: 6.9 g/dL (ref 6.0–8.5)

## 2020-02-25 LAB — LIPID PANEL
Chol/HDL Ratio: 3.8 ratio (ref 0.0–4.4)
Cholesterol, Total: 254 mg/dL — ABNORMAL HIGH (ref 100–199)
HDL: 66 mg/dL (ref >39)
LDL Chol Calc (NIH): 171 mg/dL — ABNORMAL HIGH (ref 0–99)
Triglycerides: 100 mg/dL (ref 0–149)
VLDL Cholesterol Cal: 17 mg/dL (ref 5–40)

## 2020-02-25 LAB — CBC
Hematocrit: 40.1 % (ref 34.0–46.6)
Hemoglobin: 13.5 g/dL (ref 11.1–15.9)
MCH: 30 pg (ref 26.6–33.0)
MCHC: 33.7 g/dL (ref 31.5–35.7)
MCV: 89 fL (ref 79–97)
Platelets: 249 10*3/uL (ref 150–450)
RBC: 4.5 x10E6/uL (ref 3.77–5.28)
RDW: 13.1 % (ref 11.7–15.4)
WBC: 5.9 10*3/uL (ref 3.4–10.8)

## 2020-02-25 LAB — TSH: TSH: 2.38 u[IU]/mL (ref 0.450–4.500)

## 2020-02-25 LAB — VITAMIN D 25 HYDROXY (VIT D DEFICIENCY, FRACTURES): Vit D, 25-Hydroxy: 26.1 ng/mL — ABNORMAL LOW (ref 30.0–100.0)

## 2020-02-27 ENCOUNTER — Telehealth: Payer: Self-pay

## 2020-02-27 ENCOUNTER — Encounter: Payer: Self-pay | Admitting: Obstetrics & Gynecology

## 2020-02-27 LAB — CYTOLOGY - PAP
Comment: NEGATIVE
Diagnosis: NEGATIVE
High risk HPV: NEGATIVE

## 2020-02-27 NOTE — Telephone Encounter (Signed)
-----   Message from Megan Salon, MD sent at 02/27/2020  6:41 AM EDT ----- Please let pt know her CMP, CBC, and TSH were normal.  Her cholesterol is elevated at 254 and her LDLs are 171.  She is establishing care with a new PCP and should review this with him at her visit.  Her Vit D was 26.  I'm not sure when she last took the 50,000IU dosing.  If was recent, ok to continue the prescription dosage.  She will need this called into the pharmacy.  If is actually been several months since she took this, she should start taking 2000 IUs of over-the-counter vitamin D.  Patient also requested that her lab work be sent to Dr. Bunnie Pion who is located at [facility] in the [place] area.

## 2020-02-27 NOTE — Telephone Encounter (Signed)
Left message for call back.

## 2020-02-28 NOTE — Telephone Encounter (Signed)
Patient notified of results. See lab 

## 2020-08-03 ENCOUNTER — Other Ambulatory Visit: Payer: Self-pay | Admitting: Obstetrics & Gynecology

## 2020-08-06 NOTE — Telephone Encounter (Signed)
Attempt made to contact Sarah Waller is a 60 y.o. female re: message from Dr. Sabra Heck below. Pt was not available. LM on the VM for the patient to call me back.

## 2020-08-06 NOTE — Telephone Encounter (Signed)
Could you call this pt and let her know I did this at her last visit as she hadn't established care with a new PCP but she really does need to do so.  I've declined this refill request and she should sent it to her new PCP.  Thanks.

## 2020-08-09 ENCOUNTER — Telehealth (HOSPITAL_BASED_OUTPATIENT_CLINIC_OR_DEPARTMENT_OTHER): Payer: Self-pay | Admitting: Obstetrics & Gynecology

## 2020-08-09 NOTE — Telephone Encounter (Signed)
Patient call back and stated that her PCP did her refill .

## 2021-03-15 ENCOUNTER — Ambulatory Visit (HOSPITAL_BASED_OUTPATIENT_CLINIC_OR_DEPARTMENT_OTHER): Payer: Self-pay | Admitting: Obstetrics & Gynecology

## 2021-05-03 ENCOUNTER — Other Ambulatory Visit (HOSPITAL_BASED_OUTPATIENT_CLINIC_OR_DEPARTMENT_OTHER): Payer: Self-pay | Admitting: Obstetrics & Gynecology

## 2021-05-07 NOTE — Telephone Encounter (Signed)
LMOVM for pt to call office regarding refill request 

## 2021-05-14 ENCOUNTER — Other Ambulatory Visit: Payer: Self-pay
